# Patient Record
Sex: Male | Born: 1950 | Race: White | Hispanic: No | Marital: Single | Smoking: Never smoker
Health system: Southern US, Community
[De-identification: ages and names within clinical notes are randomized; demographics above are authoritative.]

## PROBLEM LIST (undated history)

## (undated) DIAGNOSIS — G4730 Sleep apnea, unspecified: Secondary | ICD-10-CM

## (undated) DIAGNOSIS — R221 Localized swelling, mass and lump, neck: Secondary | ICD-10-CM

## (undated) DIAGNOSIS — R809 Proteinuria, unspecified: Secondary | ICD-10-CM

## (undated) DIAGNOSIS — E119 Type 2 diabetes mellitus without complications: Secondary | ICD-10-CM

## (undated) DIAGNOSIS — I10 Essential (primary) hypertension: Secondary | ICD-10-CM

## (undated) DIAGNOSIS — J392 Other diseases of pharynx: Secondary | ICD-10-CM

## (undated) DIAGNOSIS — F419 Anxiety disorder, unspecified: Secondary | ICD-10-CM

## (undated) HISTORY — DX: Type 2 diabetes mellitus without complications: E11.9

## (undated) HISTORY — PX: OTHER SURGICAL HISTORY: SHX169

## (undated) HISTORY — DX: Proteinuria, unspecified: R80.9

## (undated) HISTORY — DX: Anxiety disorder, unspecified: F41.9

## (undated) HISTORY — DX: Sleep apnea, unspecified: G47.30

## (undated) HISTORY — DX: Essential (primary) hypertension: I10

---

## 2002-05-03 ENCOUNTER — Observation Stay (HOSPITAL_COMMUNITY): Admission: EM | Admit: 2002-05-03 | Discharge: 2002-05-04 | Payer: Self-pay | Admitting: Emergency Medicine

## 2002-05-03 ENCOUNTER — Encounter: Payer: Self-pay | Admitting: Emergency Medicine

## 2005-10-04 ENCOUNTER — Ambulatory Visit: Payer: Self-pay | Admitting: Cardiology

## 2009-03-19 ENCOUNTER — Emergency Department (HOSPITAL_COMMUNITY): Admission: EM | Admit: 2009-03-19 | Discharge: 2009-03-19 | Payer: Self-pay | Admitting: Emergency Medicine

## 2010-10-27 NOTE — Discharge Summary (Signed)
   NAME:  [name], Aaron Key NO.:  [id number]   MEDICAL RECORD NO.:  [id number]                   PATIENT TYPE:  INP   LOCATION:  3735                                 FACILITY:  [facility]   PHYSICIAN:  Vesta Mixer, M.D.              DATE OF BIRTH:  06/28/1950   DATE OF ADMISSION:  05/03/2002  DATE OF DISCHARGE:  05/04/2002                                 DISCHARGE SUMMARY   DISCHARGE DIAGNOSES:  1. Chest pain, probably noncardiac.  2. History of anxiety.   DISCHARGE MEDICATIONS:  1. Enteric-coated aspirin 81 mg a day.  2. Prilosec 20 mg a day.   DISPOSITION:  The patient will see Dr. Elease Hashimoto in one week.  He is to call  sooner if he has any recurrent episodes of chest pain. We will schedule a  Cardiolite study as an outpatient.  If he has recurrent episodes of chest  pain that sound like angina, we will consider a heart catheterization.   HISTORY:  The patient is a 60 year old gentleman who was admitted with chest  pain.  Please see dictated H&P for further details.   HOSPITAL COURSE:  CHEST PAIN:  The patient ruled out for myocardial infarction.  His EKG  remained unchanged.  Given the atypical nature of his chest pain, he will be  discharged from the hospital today.  He wanted to wait until next week,  until after Thanksgiving to do the stress test.  He will come back to the  office at that time to discuss doing the stress test. I have asked him to  call me right away if he has any worsening problems.  He will be on a low-  fat, low-salt, low-cholesterol in the meantime.                                                Vesta Mixer, M.D.    PJN/MEDQ  D:  05/04/2002  T:  05/04/2002  Job:  [id number]

## 2010-10-27 NOTE — H&P (Signed)
NAME:  Aaron Key, DONATH NO.:  [id number]   MEDICAL RECORD NO.:  [id number]                   PATIENT TYPE:  INP   LOCATION:  3735                                 FACILITY:  [facility]   PHYSICIAN:  Vesta Mixer, M.D.              DATE OF BIRTH:  May 20, 1951   DATE OF ADMISSION:  05/03/2002  DATE OF DISCHARGE:                                HISTORY & PHYSICAL   HISTORY OF PRESENT ILLNESS:  The patient is a 60 year old gentleman with a  history of chest pains in the past.  He is admitted to the hospital for  recurrent episodes of chest pain.   The patient has been relatively healthy.  He has had some gastroesophageal  problems.  He has had chest pains in the past and had a negative stress test  approximately 5 years ago.   This morning he had the severe onset of chest pain associated with some  shortness of breath and mild diaphoresis.  The pain lasted between 4 and 5  minutes and resolved after he got up and stretched.  He belched and felt a  little bit better.  He went on to church but continued to feel uneasy.  He  presented to the hospital for further evaluation.  He has not had any  further episodes of chest pain since he has been here.   CURRENT MEDICATION:  Prilosec 20 mg a day.   ALLERGY:  He is allergic to PENICILLIN.   PAST MEDICAL HISTORY:  1. History of achalasia and a slow esophagus.  2. History of chest pain.   SOCIAL HISTORY:  The patient is a nonsmoker.  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   FAMILY HISTORY:  Noncontributory.   PHYSICAL EXAMINATION:  GENERAL: He is a middle-aged gentleman in no acute  distress.  He is alert and oriented x3 and his mood and affect are normal.  VITAL SIGNS: His vital signs are stable.  His heart rate is 82.  HEENT: Exam reveals 2+ carotids, he has no bruits, there is no JVD and no  thyromegaly.  LUNGS: Clear to auscultation.  HEART: Regular rate S1/S2.  He has no murmurs, gallops, or rubs.  His  chest  wall is nontender.  ABDOMEN: Good bowel sounds.  He has no hepatosplenomegaly.  He has no masses  or bruits.  EXTREMITIES: He has good pulses.  He has no calf tenderness.  There is no  clubbing, cyanosis, or edema.  NEUROLOGICAL: Cranial nerves II-XII are intact and his motor and sensory  function intact.  His gait was not assessed.   LABORATORY DATA:  His EKG reveals normal sinus rhythm.  He has very tiny Q  waves in the lateral leads.  He has no acute ST or T wave changes.   IMPRESSION:  The patient presents with rather atypical episodes of chest  pain.    PLAN:  We  will admit him for further evaluation.  We will collect serial  CPK's.  His initial set of CPK-MB was slightly elevated.  We will consider  heart catheterization versus an outpatient Cardiolite study based on the  workup overnight.  He is stable for the time being and does not need IV  heparin or nitroglycerin at this time.  His other medical problems remain  stable.                                               Vesta Mixer, M.D.    PJN/MEDQ  D:  05/03/2002  T:  05/03/2002  Job:  [id number]   cc:   Dr. Elby Beck

## 2012-11-24 ENCOUNTER — Ambulatory Visit (INDEPENDENT_AMBULATORY_CARE_PROVIDER_SITE_OTHER): Payer: BC Managed Care – PPO | Admitting: Family Medicine

## 2012-11-24 ENCOUNTER — Encounter: Payer: Self-pay | Admitting: Family Medicine

## 2012-11-24 VITALS — BP 132/80 | Temp 98.4°F | Wt 236.8 lb

## 2012-11-24 DIAGNOSIS — M7042 Prepatellar bursitis, left knee: Secondary | ICD-10-CM

## 2012-11-24 DIAGNOSIS — Z79899 Other long term (current) drug therapy: Secondary | ICD-10-CM

## 2012-11-24 DIAGNOSIS — E782 Mixed hyperlipidemia: Secondary | ICD-10-CM

## 2012-11-24 DIAGNOSIS — E119 Type 2 diabetes mellitus without complications: Secondary | ICD-10-CM

## 2012-11-24 DIAGNOSIS — M7040 Prepatellar bursitis, unspecified knee: Secondary | ICD-10-CM

## 2012-11-24 DIAGNOSIS — Z125 Encounter for screening for malignant neoplasm of prostate: Secondary | ICD-10-CM

## 2012-11-24 MED ORDER — LOSARTAN POTASSIUM 100 MG PO TABS: 100.0000 mg | ORAL_TABLET | Freq: Every day | ORAL | Status: DC

## 2012-11-24 MED ORDER — ETODOLAC 400 MG PO TABS: 400.0000 mg | ORAL_TABLET | Freq: Two times a day (BID) | ORAL | Status: DC

## 2012-11-24 MED ORDER — METFORMIN HCL 500 MG PO TABS: 1000.0000 mg | ORAL_TABLET | Freq: Two times a day (BID) | ORAL | Status: DC

## 2012-11-24 MED ORDER — OMEPRAZOLE 20 MG PO CPDR: 40.0000 mg | DELAYED_RELEASE_CAPSULE | Freq: Every day | ORAL | Status: DC

## 2012-11-24 MED ORDER — GLYBURIDE 5 MG PO TABS: 5.0000 mg | ORAL_TABLET | Freq: Two times a day (BID) | ORAL | Status: DC

## 2012-11-24 MED ORDER — CEPHALEXIN 500 MG PO CAPS: 500.0000 mg | ORAL_CAPSULE | Freq: Three times a day (TID) | ORAL | Status: DC

## 2012-11-24 NOTE — Progress Notes (Signed)
  Subjective:    Patient ID: Aaron Key, male    DOB: [birth year], 62 y.o.   MRN: [id number]  Knee Pain  The incident occurred more than 1 week ago. The incident occurred at home. The injury mechanism is unknown. The pain is present in the left knee. The quality of the pain is described as aching. The pain is at a severity of 5/10. The pain is moderate. The pain has been fluctuating since onset. He reports no foreign bodies present. The symptoms are aggravated by weight bearing. He has tried NSAIDs for the symptoms. The treatment provided moderate relief.   Recurrent pain and redness and swelling. Hx of chronic inflam of knee.  otc advil two at night, helped some   Review of Systems No fever or chills. Notes achiness elsewhere. No major history of knee injury. ROS otherwise negative    Objective:   Physical Exam  Alert mild distress. Vitals reviewed. Lungs clear. Heart regular in rhythm. HEENT normal. Left anterior knee warm inflamed tender joint itself good range of motion no crepitations      Assessment & Plan:   impression prepatellar bursitis-discussed plan Lodine 400 twice a day with food. Keflex 500 3 times a day to cover any bacterial component discussed. Also chronic meds refilled and appropriate blood work ordered recheck in one month. WSL

## 2012-12-10 LAB — BASIC METABOLIC PANEL
BUN: 19 mg/dL (ref 6–23)
CO2: 25 mEq/L (ref 19–32)
Chloride: 103 mEq/L (ref 96–112)
Creat: 1.06 mg/dL (ref 0.50–1.35)
Glucose, Bld: 209 mg/dL — ABNORMAL HIGH (ref 70–99)
Potassium: 4.7 mEq/L (ref 3.5–5.3)

## 2012-12-10 LAB — LIPID PANEL
HDL: 31 mg/dL — ABNORMAL LOW (ref >39)
LDL Cholesterol: 88 mg/dL (ref 0–99)
Total CHOL/HDL Ratio: 5.1 Ratio
Triglycerides: 201 mg/dL — ABNORMAL HIGH (ref <150)
VLDL: 40 mg/dL (ref 0–40)

## 2012-12-10 LAB — HEPATIC FUNCTION PANEL
Albumin: 4.6 g/dL (ref 3.5–5.2)
Total Bilirubin: 1 mg/dL (ref 0.3–1.2)
Total Protein: 7 g/dL (ref 6.0–8.3)

## 2012-12-11 LAB — MICROALBUMIN, URINE: Microalb, Ur: 1.98 mg/dL — ABNORMAL HIGH (ref 0.00–1.89)

## 2012-12-19 ENCOUNTER — Encounter: Payer: Self-pay | Admitting: *Deleted

## 2012-12-24 ENCOUNTER — Encounter: Payer: Self-pay | Admitting: Family Medicine

## 2012-12-24 ENCOUNTER — Ambulatory Visit (INDEPENDENT_AMBULATORY_CARE_PROVIDER_SITE_OTHER): Payer: BC Managed Care – PPO | Admitting: Family Medicine

## 2012-12-24 VITALS — BP 130/84 | Wt 231.0 lb

## 2012-12-24 DIAGNOSIS — E785 Hyperlipidemia, unspecified: Secondary | ICD-10-CM

## 2012-12-24 DIAGNOSIS — G4733 Obstructive sleep apnea (adult) (pediatric): Secondary | ICD-10-CM

## 2012-12-24 DIAGNOSIS — E119 Type 2 diabetes mellitus without complications: Secondary | ICD-10-CM | POA: Insufficient documentation

## 2012-12-24 DIAGNOSIS — E1149 Type 2 diabetes mellitus with other diabetic neurological complication: Secondary | ICD-10-CM

## 2012-12-24 DIAGNOSIS — K219 Gastro-esophageal reflux disease without esophagitis: Secondary | ICD-10-CM

## 2012-12-24 DIAGNOSIS — I10 Essential (primary) hypertension: Secondary | ICD-10-CM

## 2012-12-24 DIAGNOSIS — IMO0001 Reserved for inherently not codable concepts without codable children: Secondary | ICD-10-CM

## 2012-12-24 DIAGNOSIS — E1129 Type 2 diabetes mellitus with other diabetic kidney complication: Secondary | ICD-10-CM

## 2012-12-24 DIAGNOSIS — R809 Proteinuria, unspecified: Secondary | ICD-10-CM

## 2012-12-24 NOTE — Progress Notes (Signed)
Subjective:    Patient ID: Aaron Key, male    DOB: November 20, 1950, 62 y.o.   MRN: [id number]  Diabetes He presents for his follow-up diabetic visit. He has type 2 diabetes mellitus. His disease course has been fluctuating. Pertinent negatives for diabetes include no blurred vision, no chest pain and no fatigue. There are no hypoglycemic complications. Symptoms are stable. There are no diabetic complications. Risk factors for coronary artery disease include diabetes mellitus, dyslipidemia, hypertension and male sex. Current diabetic treatment includes oral agent (monotherapy). He is compliant with treatment most of the time. His weight is increasing steadily. He is following a generally unhealthy diet. Meal planning includes avoidance of concentrated sweets. He has not had a previous visit with a dietician. He rarely participates in exercise. His breakfast blood glucose is taken between 8-9 am. His breakfast blood glucose range is generally 130-140 mg/dl. An ACE inhibitor/angiotensin II receptor blocker is being taken. Eye exam is not current.   Not the best watching fats in the diet. A lot of fast food. occas low sugar spells. Knee pain has calmed down  History of sleep apnea. Patient claims compliance with CPAP device. Still definitely helps him.  History micro-proteinuria wonders about numbers at this time.  Trying to watch his blood pressure. He has cut down salt intake. Compliant with meds. Review of Systems  Constitutional: Negative for fatigue.  Eyes: Negative for blurred vision.  Cardiovascular: Negative for chest pain.       Objective:   Physical Exam Alert no acute distress. HEENT normal. Lungs clear. Heart regular rate and rhythm. Feet sensation intact. Pulses good.   Results for orders placed in visit on 11/24/12  BASIC METABOLIC PANEL      Result Value Range   Sodium 140  135 - 145 mEq/L   Potassium 4.7  3.5 - 5.3 mEq/L   Chloride 103  96 - 112 mEq/L   CO2 25  19 - 32  mEq/L   Glucose, Bld 209 (*) 70 - 99 mg/dL   BUN 19  6 - 23 mg/dL   Creat 4.09  8.11 - 9.14 mg/dL   Calcium 9.5  8.4 - 78.2 mg/dL  HEMOGLOBIN N5A      Result Value Range   Hemoglobin A1C 7.0 (*) <5.7 %   Mean Plasma Glucose 154 (*) <117 mg/dL  PSA      Result Value Range   PSA 1.36  <=4.00 ng/mL  LIPID PANEL      Result Value Range   Cholesterol 159  0 - 200 mg/dL   Triglycerides 213 (*) <150 mg/dL   HDL 31 (*) >08 mg/dL   Total CHOL/HDL Ratio 5.1     VLDL 40  0 - 40 mg/dL   LDL Cholesterol 88  0 - 99 mg/dL  HEPATIC FUNCTION PANEL      Result Value Range   Total Bilirubin 1.0  0.3 - 1.2 mg/dL   Bilirubin, Direct 0.2  0.0 - 0.3 mg/dL   Indirect Bilirubin 0.8  0.0 - 0.9 mg/dL   Alkaline Phosphatase 111  39 - 117 U/L   AST 15  0 - 37 U/L   ALT 13  0 - 53 U/L   Total Protein 7.0  6.0 - 8.3 g/dL   Albumin 4.6  3.5 - 5.2 g/dL  MICROALBUMIN, URINE      Result Value Range   Microalb, Ur 1.98 (*) 0.00 - 1.89 mg/dL       Assessment & Plan:  Impression #1 type 2 diabetes decent control. #2 hypertension good control. #3 hyperlipidemia and discuss. #4 micro-proteinuria improved. #5 sleep apnea ongoing. #6 reflux stable. Plan maintain same meds. Diet exercise discussed. Recheck in 6 months. Earlier if any difficulties. WSL

## 2013-04-06 ENCOUNTER — Other Ambulatory Visit: Payer: Self-pay | Admitting: Family Medicine

## 2013-05-08 ENCOUNTER — Other Ambulatory Visit: Payer: Self-pay | Admitting: Family Medicine

## 2013-06-09 ENCOUNTER — Other Ambulatory Visit: Payer: Self-pay | Admitting: Family Medicine

## 2013-06-15 ENCOUNTER — Encounter: Payer: Self-pay | Admitting: Family Medicine

## 2013-06-15 ENCOUNTER — Ambulatory Visit (INDEPENDENT_AMBULATORY_CARE_PROVIDER_SITE_OTHER): Payer: BC Managed Care – PPO | Admitting: Family Medicine

## 2013-06-15 VITALS — BP 130/88 | Ht 70.0 in | Wt 237.5 lb

## 2013-06-15 DIAGNOSIS — IMO0001 Reserved for inherently not codable concepts without codable children: Secondary | ICD-10-CM

## 2013-06-15 DIAGNOSIS — E119 Type 2 diabetes mellitus without complications: Secondary | ICD-10-CM

## 2013-06-15 DIAGNOSIS — R809 Proteinuria, unspecified: Secondary | ICD-10-CM

## 2013-06-15 DIAGNOSIS — E1129 Type 2 diabetes mellitus with other diabetic kidney complication: Secondary | ICD-10-CM

## 2013-06-15 DIAGNOSIS — I10 Essential (primary) hypertension: Secondary | ICD-10-CM

## 2013-06-15 DIAGNOSIS — G4733 Obstructive sleep apnea (adult) (pediatric): Secondary | ICD-10-CM

## 2013-06-15 DIAGNOSIS — K219 Gastro-esophageal reflux disease without esophagitis: Secondary | ICD-10-CM

## 2013-06-15 LAB — POCT GLYCOSYLATED HEMOGLOBIN (HGB A1C): HEMOGLOBIN A1C: 7.2

## 2013-06-15 MED ORDER — OMEPRAZOLE 20 MG PO CPDR: 40.0000 mg | DELAYED_RELEASE_CAPSULE | Freq: Every day | ORAL | Status: DC

## 2013-06-15 MED ORDER — LOSARTAN POTASSIUM 100 MG PO TABS: 100.0000 mg | ORAL_TABLET | Freq: Every day | ORAL | Status: DC

## 2013-06-15 MED ORDER — GLYBURIDE 5 MG PO TABS: 5.0000 mg | ORAL_TABLET | Freq: Two times a day (BID) | ORAL | Status: DC

## 2013-06-15 MED ORDER — METFORMIN HCL 500 MG PO TABS: ORAL_TABLET | ORAL | Status: DC

## 2013-06-15 NOTE — Progress Notes (Signed)
   Subjective:    Patient ID: Aaron Key, male    DOB: 06/22/1950, 63 y.o.   MRN: [id number]  Diabetes He presents for his follow-up diabetic visit. He has type 2 diabetes mellitus. His disease course has been improving. There are no hypoglycemic associated symptoms. There are no diabetic associated symptoms. There are no hypoglycemic complications. Symptoms are stable. There are no diabetic complications. There are no known risk factors for coronary artery disease. Current diabetic treatment includes oral agent (dual therapy). He is compliant with treatment all of the time.   patient claims compliance with his blood pressure medicine. Blood pressure good when checked elsewhere. No obvious side effects from the medicine.  Compliant with reflux medicine. Overall doing good job for him.  Uses his CPAP faithfully. Helps his sleep apnea considerably. Uses every night.   Patient has no other concerns at this time.     Review of Systems No chest pain no headache no back pain no change about habits no blood in stool ROS otherwise negative.    Objective:   Physical Exam  Alert obesity present. H&T normal. Lungs clear. Heart rare rhythm. Ankles without edema.  Results for orders placed in visit on 06/15/13  POCT GLYCOSYLATED HEMOGLOBIN (HGB A1C)      Result Value Range   Hemoglobin A1C 7.2         Assessment & Plan:  Impression 1 type 2 diabetes control good though not perfect. Discussed length. #Hypertension good control. #3 sleep apnea ongoing. #4 reflux stable. Plan diet exercise discussed in encourage. Followup as scheduled. A1c need to improve at next visit. Rationale discussed. WSL

## 2013-06-21 LAB — HM DIABETES EYE EXAM

## 2013-12-24 ENCOUNTER — Telehealth: Payer: Self-pay | Admitting: Family Medicine

## 2013-12-24 DIAGNOSIS — I10 Essential (primary) hypertension: Secondary | ICD-10-CM

## 2013-12-24 DIAGNOSIS — E119 Type 2 diabetes mellitus without complications: Secondary | ICD-10-CM

## 2013-12-24 DIAGNOSIS — Z79899 Other long term (current) drug therapy: Secondary | ICD-10-CM

## 2013-12-24 DIAGNOSIS — Z125 Encounter for screening for malignant neoplasm of prostate: Secondary | ICD-10-CM

## 2013-12-24 NOTE — Telephone Encounter (Signed)
PT has appt 8/5 does he need bw   Call when ready

## 2013-12-24 NOTE — Telephone Encounter (Signed)
That pt needs that exact same test as 1 year ago and f/u ov to dicuss

## 2013-12-24 NOTE — Telephone Encounter (Signed)
Patient had Lipid, Liver, Met 7, HgbA1c, PSA, Micro Albumin urine on 12/2012

## 2013-12-25 NOTE — Telephone Encounter (Signed)
Patient notified and verbalized understanding. 

## 2013-12-30 ENCOUNTER — Other Ambulatory Visit: Payer: Self-pay | Admitting: Family Medicine

## 2014-01-06 LAB — HEMOGLOBIN A1C
Hgb A1c MFr Bld: 8.7 % — ABNORMAL HIGH (ref <5.7)
Mean Plasma Glucose: 203 mg/dL — ABNORMAL HIGH (ref <117)

## 2014-01-06 LAB — BASIC METABOLIC PANEL
BUN: 20 mg/dL (ref 6–23)
CO2: 28 mEq/L (ref 19–32)
Calcium: 9.3 mg/dL (ref 8.4–10.5)
Chloride: 102 mEq/L (ref 96–112)
Creat: 0.96 mg/dL (ref 0.50–1.35)
GLUCOSE: 258 mg/dL — AB (ref 70–99)
Potassium: 4.5 mEq/L (ref 3.5–5.3)
Sodium: 139 mEq/L (ref 135–145)

## 2014-01-06 LAB — LIPID PANEL
CHOLESTEROL: 149 mg/dL (ref 0–200)
HDL: 29 mg/dL — ABNORMAL LOW (ref >39)
LDL Cholesterol: 60 mg/dL (ref 0–99)
Total CHOL/HDL Ratio: 5.1 Ratio
Triglycerides: 300 mg/dL — ABNORMAL HIGH (ref <150)
VLDL: 60 mg/dL — ABNORMAL HIGH (ref 0–40)

## 2014-01-06 LAB — HEPATIC FUNCTION PANEL
ALT: 18 U/L (ref 0–53)
AST: 13 U/L (ref 0–37)
Albumin: 4.5 g/dL (ref 3.5–5.2)
Alkaline Phosphatase: 115 U/L (ref 39–117)
BILIRUBIN DIRECT: 0.2 mg/dL (ref 0.0–0.3)
BILIRUBIN INDIRECT: 1.1 mg/dL (ref 0.2–1.2)
Total Bilirubin: 1.3 mg/dL — ABNORMAL HIGH (ref 0.2–1.2)
Total Protein: 7.2 g/dL (ref 6.0–8.3)

## 2014-01-07 LAB — PSA: PSA: 1.23 ng/mL (ref <=4.00)

## 2014-01-07 LAB — MICROALBUMIN, URINE: Microalb, Ur: 4.74 mg/dL — ABNORMAL HIGH (ref 0.00–1.89)

## 2014-01-13 ENCOUNTER — Ambulatory Visit (INDEPENDENT_AMBULATORY_CARE_PROVIDER_SITE_OTHER): Payer: BC Managed Care – PPO | Admitting: Family Medicine

## 2014-01-13 ENCOUNTER — Encounter: Payer: Self-pay | Admitting: Family Medicine

## 2014-01-13 VITALS — BP 144/88 | Ht 71.0 in | Wt 226.6 lb

## 2014-01-13 DIAGNOSIS — E1149 Type 2 diabetes mellitus with other diabetic neurological complication: Secondary | ICD-10-CM

## 2014-01-13 DIAGNOSIS — E785 Hyperlipidemia, unspecified: Secondary | ICD-10-CM

## 2014-01-13 DIAGNOSIS — K219 Gastro-esophageal reflux disease without esophagitis: Secondary | ICD-10-CM

## 2014-01-13 DIAGNOSIS — I10 Essential (primary) hypertension: Secondary | ICD-10-CM

## 2014-01-13 DIAGNOSIS — G4733 Obstructive sleep apnea (adult) (pediatric): Secondary | ICD-10-CM

## 2014-01-13 MED ORDER — LOSARTAN POTASSIUM 100 MG PO TABS: 100.0000 mg | ORAL_TABLET | Freq: Every day | ORAL | Status: DC

## 2014-01-13 MED ORDER — GLIPIZIDE 5 MG PO TABS: ORAL_TABLET | ORAL | Status: DC

## 2014-01-13 MED ORDER — OMEPRAZOLE 20 MG PO CPDR: 40.0000 mg | DELAYED_RELEASE_CAPSULE | Freq: Every day | ORAL | Status: DC

## 2014-01-13 MED ORDER — METFORMIN HCL 500 MG PO TABS: ORAL_TABLET | ORAL | Status: DC

## 2014-01-13 NOTE — Progress Notes (Signed)
   Subjective:    Patient ID: Aaron Key, male    DOB: 1951-02-09, 62 y.o.   MRN: [id number]  Diabetes He presents for his follow-up diabetic visit. He has type 2 diabetes mellitus. Current diabetic treatment includes oral agent (dual therapy). He is compliant with treatment all of the time. He is following a diabetic diet. He rarely participates in exercise. His breakfast blood glucose range is generally 140-180 mg/dl. He does not see a podiatrist.Eye exam is current.   Had bloodwork on 01/06/14. A1C was 8.7 on bloodwork.  Results for orders placed in visit on 12/24/13  LIPID PANEL      Result Value Ref Range   Cholesterol 149  0 - 200 mg/dL   Triglycerides 300 (*) <150 mg/dL   HDL 29 (*) >39 mg/dL   Total CHOL/HDL Ratio 5.1     VLDL 60 (*) 0 - 40 mg/dL   LDL Cholesterol 60  0 - 99 mg/dL  HEPATIC FUNCTION PANEL      Result Value Ref Range   Total Bilirubin 1.3 (*) 0.2 - 1.2 mg/dL   Bilirubin, Direct 0.2  0.0 - 0.3 mg/dL   Indirect Bilirubin 1.1  0.2 - 1.2 mg/dL   Alkaline Phosphatase 115  39 - 117 U/L   AST 13  0 - 37 U/L   ALT 18  0 - 53 U/L   Total Protein 7.2  6.0 - 8.3 g/dL   Albumin 4.5  3.5 - 5.2 g/dL  BASIC METABOLIC PANEL      Result Value Ref Range   Sodium 139  135 - 145 mEq/L   Potassium 4.5  3.5 - 5.3 mEq/L   Chloride 102  96 - 112 mEq/L   CO2 28  19 - 32 mEq/L   Glucose, Bld 258 (*) 70 - 99 mg/dL   BUN 20  6 - 23 mg/dL   Creat 0.96  0.50 - 1.35 mg/dL   Calcium 9.3  8.4 - 10.5 mg/dL  PSA      Result Value Ref Range   PSA 1.23  <=4.00 ng/mL  HEMOGLOBIN A1C      Result Value Ref Range   Hemoglobin A1C 8.7 (*) <5.7 %   Mean Plasma Glucose 203 (*) <117 mg/dL  MICROALBUMIN, URINE      Result Value Ref Range   Microalb, Ur 4.74 (*) 0.00 - 1.89 mg/dL   Does not ck bp  Any more, compliant with meds. Patient trying to watch salt intake. Unfortunately not exercising very much.  Continues to use his CPAP device for sleep. States it definitely  helps.  Review of Systems No headache no chest pain no back pain no abdominal pain no change about blood in stool ROS otherwise negative    Objective:   Physical Exam Alert no apparent distress. HEENT normal. Lungs clear. Heart regular in rhythm ankles without edema. C. diabetic foot exam       Assessment & Plan:  Impression 1 type 2 diabetes unsatisfactory control discussed at length. #2 hypertension good control currently #3 hyperlipidemia overall good control except triglycerides discussed #4 sleep apnea plan diet exercise discussed. Justin dose will double up glyburide and change it to glipizide. Not skip meals potential for low sugar spells discussed. Diet exercise discussed recheck in 3 months for wellness exam in 6 months for diabetes visit. WSL

## 2014-03-24 ENCOUNTER — Ambulatory Visit (INDEPENDENT_AMBULATORY_CARE_PROVIDER_SITE_OTHER): Payer: BC Managed Care – PPO | Admitting: Family Medicine

## 2014-03-24 ENCOUNTER — Ambulatory Visit: Payer: BC Managed Care – PPO

## 2014-03-24 ENCOUNTER — Encounter: Payer: Self-pay | Admitting: Family Medicine

## 2014-03-24 VITALS — BP 150/84 | Temp 98.8°F | Ht 71.0 in | Wt 226.0 lb

## 2014-03-24 DIAGNOSIS — J329 Chronic sinusitis, unspecified: Secondary | ICD-10-CM

## 2014-03-24 MED ORDER — AZITHROMYCIN 250 MG PO TABS
ORAL_TABLET | ORAL | Status: DC
Start: 2014-03-24 — End: 2014-04-21

## 2014-03-24 NOTE — Progress Notes (Signed)
   Subjective:    Patient ID: Aaron Key, male    DOB: 06/16/1950, 63 y.o.   MRN: [id number]  URI  This is a new problem. The current episode started yesterday. The problem has been unchanged. Maximum temperature: unmeasured. Associated symptoms include congestion, coughing and headaches. He has tried nothing for the symptoms. The treatment provided no relief.   Patient states that he has no other concerns at this time.   Low gr fever   Several others at work have similar symptoms.  Cough occasionally productive. Some frontal headache. Worse with coughing    Review of Systems  HENT: Positive for congestion.   Respiratory: Positive for cough.   Neurological: Positive for headaches.       Objective:   Physical Exam Alert moderate malaise. Vital stable. Frontal maxillary tenderness evident. Pharynx normal neck supple. Lungs clear. Heart regular rate and rhythm. We'll impression rhinosinusitis       Assessment & Plan:  Impression rhinosinusitis along with viral syndrome plan antibiotics prescribed. Symptomatic care discussed. Warning signs discussed. WSL

## 2014-04-21 ENCOUNTER — Ambulatory Visit (INDEPENDENT_AMBULATORY_CARE_PROVIDER_SITE_OTHER): Payer: BC Managed Care – PPO | Admitting: Family Medicine

## 2014-04-21 ENCOUNTER — Encounter: Payer: Self-pay | Admitting: Family Medicine

## 2014-04-21 VITALS — BP 142/78 | Ht 68.5 in | Wt 225.2 lb

## 2014-04-21 DIAGNOSIS — Z23 Encounter for immunization: Secondary | ICD-10-CM

## 2014-04-21 DIAGNOSIS — Z0000 Encounter for general adult medical examination without abnormal findings: Secondary | ICD-10-CM

## 2014-04-21 DIAGNOSIS — E0921 Drug or chemical induced diabetes mellitus with diabetic nephropathy: Secondary | ICD-10-CM

## 2014-04-21 DIAGNOSIS — E0869 Diabetes mellitus due to underlying condition with other specified complication: Secondary | ICD-10-CM

## 2014-04-21 MED ORDER — CANAGLIFLOZIN 300 MG PO TABS: ORAL_TABLET | ORAL | Status: DC

## 2014-04-21 MED ORDER — CANAGLIFLOZIN 100 MG PO TABS: ORAL_TABLET | ORAL | Status: DC

## 2014-04-21 NOTE — Progress Notes (Signed)
   Subjective:    Patient ID: Aaron Key, male    DOB: Oct 14, 1950, 63 y.o.   MRN: [id number]  HPI The patient comes in today for a wellness visit.    A review of their health history was completed.  A review of medications was also completed.  Any needed refills; none  Eating habits: pretty good  Falls/ MVA accidents in past few months: none  Regular exercise: no  Specialist pt sees on regular basis: none  Preventative health issues were discussed.   Additional concerns: none  Exercise not regular  Results for orders placed or performed in visit on 04/21/14  HM DIABETES EYE EXAM  Result Value Ref Range   HM Diabetic Eye Exam No Retinopathy No Retinopathy   Sugars unfortunately continue to remain high. Patient reports not exercising very much. Also reports fair compliance with diet but not the best possible.  Review of Systems  Constitutional: Negative for fever, activity change and appetite change.       Some fatigue at times.  HENT: Negative for congestion and rhinorrhea.   Eyes: Negative for discharge.  Respiratory: Negative for cough and wheezing.   Cardiovascular: Negative for chest pain.  Gastrointestinal: Negative for vomiting, abdominal pain and blood in stool.  Genitourinary: Negative for frequency and difficulty urinating.  Musculoskeletal: Negative for neck pain.  Skin: Negative for rash.  Allergic/Immunologic: Negative for environmental allergies and food allergies.  Neurological: Negative for weakness and headaches.  Psychiatric/Behavioral: Negative for agitation.  All other systems reviewed and are negative.      Objective:   Physical Exam  Constitutional: He appears well-developed and well-nourished.  HENT:  Head: Normocephalic and atraumatic.  Right Ear: External ear normal.  Left Ear: External ear normal.  Nose: Nose normal.  Mouth/Throat: Oropharynx is clear and moist.  Eyes: EOM are normal. Pupils are equal, round, and reactive to  light.  Neck: Normal range of motion. Neck supple. No thyromegaly present.  Cardiovascular: Normal rate, regular rhythm and normal heart sounds.   No murmur heard. Pulmonary/Chest: Effort normal and breath sounds normal. No respiratory distress. He has no wheezes.  Abdominal: Soft. Bowel sounds are normal. He exhibits no distension and no mass. There is no tenderness.  Genitourinary: Prostate normal and penis normal.  Musculoskeletal: Normal range of motion. He exhibits no edema.  Lymphadenopathy:    He has no cervical adenopathy.  Neurological: He is alert. He exhibits normal muscle tone.  Skin: Skin is warm and dry. No erythema.  Psychiatric: He has a normal mood and affect. His behavior is normal. Judgment normal.  Vitals reviewed.         Assessment & Plan:  Impression #1 wellness exam #2 type 2 diabetes control suboptimal in discussed. Time near add additional medication. Significant discussion held. We'll proceed with invokana plan diet exercise discussed. Appropriate medicines prescribed. Patient work harder on diet exercise. Flu vaccine today. WSL

## 2014-07-07 ENCOUNTER — Other Ambulatory Visit: Payer: Self-pay | Admitting: Family Medicine

## 2014-07-28 ENCOUNTER — Encounter: Payer: Self-pay | Admitting: Family Medicine

## 2014-07-28 ENCOUNTER — Ambulatory Visit (INDEPENDENT_AMBULATORY_CARE_PROVIDER_SITE_OTHER): Payer: BLUE CROSS/BLUE SHIELD | Admitting: Family Medicine

## 2014-07-28 VITALS — BP 150/86 | Ht 68.5 in | Wt 226.0 lb

## 2014-07-28 DIAGNOSIS — G4733 Obstructive sleep apnea (adult) (pediatric): Secondary | ICD-10-CM

## 2014-07-28 DIAGNOSIS — I10 Essential (primary) hypertension: Secondary | ICD-10-CM

## 2014-07-28 DIAGNOSIS — E785 Hyperlipidemia, unspecified: Secondary | ICD-10-CM

## 2014-07-28 DIAGNOSIS — E119 Type 2 diabetes mellitus without complications: Secondary | ICD-10-CM

## 2014-07-28 LAB — POCT GLYCOSYLATED HEMOGLOBIN (HGB A1C): HEMOGLOBIN A1C: 6.4

## 2014-07-28 MED ORDER — OMEPRAZOLE 20 MG PO CPDR: 40.0000 mg | DELAYED_RELEASE_CAPSULE | Freq: Every day | ORAL | Status: DC

## 2014-07-28 MED ORDER — CANAGLIFLOZIN 300 MG PO TABS: 300.0000 mg | ORAL_TABLET | Freq: Every day | ORAL | Status: DC

## 2014-07-28 MED ORDER — LOSARTAN POTASSIUM 100 MG PO TABS: 100.0000 mg | ORAL_TABLET | Freq: Every day | ORAL | Status: DC

## 2014-07-28 MED ORDER — GLIPIZIDE 5 MG PO TABS: 10.0000 mg | ORAL_TABLET | Freq: Two times a day (BID) | ORAL | Status: DC

## 2014-07-28 MED ORDER — METFORMIN HCL 500 MG PO TABS: ORAL_TABLET | ORAL | Status: DC

## 2014-07-28 NOTE — Progress Notes (Signed)
   Subjective:    Patient ID: Aaron Key, male    DOB: [birth year], 64 y.o.   MRN: [id number]  Diabetes He presents for his follow-up diabetic visit. He has type 2 diabetes mellitus. There are no hypoglycemic associated symptoms. There are no diabetic associated symptoms. There are no hypoglycemic complications. Symptoms are stable. There are no diabetic complications. Current diabetic treatment includes oral agent (triple therapy) (INVOKANA, METFORMIN & GLIPIZIDE). He is compliant with treatment all of the time. His weight is stable. He has not had a previous visit with a dietitian. Exercise: PATIENT WALKS ALOT @ WORK. His home blood glucose trend is decreasing steadily (HOME MONITORING WAS IN 170'S DOWN TO 140-150). His overall blood glucose range is 140-180 mg/dl. He does not see a podiatrist.Eye exam is current.   Results for orders placed or performed in visit on 07/28/14  POCT glycosylated hemoglobin (Hb A1C)  Result Value Ref Range   Hemoglobin A1C 6.4     Sleep apnea using mask faithfully  Pt needs filter and tubing for cpap machine. Overall the C [place] still definitely helping patients breathing and daytime restfulness.  Compliant with blood pressure medication. No obvious side effects. Watching salt intake.  Walking some on the weekend but mostly working a lot of overtime and not getting regular exercise.  Reflux overall is stable  Review of Systems No headache no chest pain no back pain abdominal pain no change in bowel habits no blood in stool    Objective:   Physical Exam Alert no acute distress vital stable HEENT normal. Blood pressure improved on repeat 136/84. Lungs clear. Heart regular in rhythm. Ankles without edema.       Assessment & Plan:  Impression 1 type 2 diabetes control still good discussed #2 hypertension good control #3 sleep apnea ongoing discussed #4 reflux clinically stable #5 obesity discussed plan exercise read encourage. Maintain other  Medications. Follow-up as scheduled. Medications refilled. WSL

## 2014-09-01 LAB — HM DIABETES EYE EXAM

## 2015-01-26 ENCOUNTER — Ambulatory Visit: Payer: BLUE CROSS/BLUE SHIELD | Admitting: Family Medicine

## 2015-03-02 ENCOUNTER — Ambulatory Visit (INDEPENDENT_AMBULATORY_CARE_PROVIDER_SITE_OTHER): Payer: BLUE CROSS/BLUE SHIELD | Admitting: Family Medicine

## 2015-03-02 ENCOUNTER — Encounter: Payer: Self-pay | Admitting: Family Medicine

## 2015-03-02 VITALS — BP 130/82 | Ht 68.5 in | Wt 222.4 lb

## 2015-03-02 DIAGNOSIS — Z23 Encounter for immunization: Secondary | ICD-10-CM | POA: Diagnosis not present

## 2015-03-02 DIAGNOSIS — E785 Hyperlipidemia, unspecified: Secondary | ICD-10-CM | POA: Diagnosis not present

## 2015-03-02 DIAGNOSIS — Z125 Encounter for screening for malignant neoplasm of prostate: Secondary | ICD-10-CM | POA: Diagnosis not present

## 2015-03-02 DIAGNOSIS — E119 Type 2 diabetes mellitus without complications: Secondary | ICD-10-CM | POA: Diagnosis not present

## 2015-03-02 LAB — POCT GLYCOSYLATED HEMOGLOBIN (HGB A1C): HEMOGLOBIN A1C: 6.3

## 2015-03-02 MED ORDER — METFORMIN HCL 500 MG PO TABS
ORAL_TABLET | ORAL | Status: DC
Start: 2015-03-02 — End: 2015-08-29

## 2015-03-02 MED ORDER — CANAGLIFLOZIN 300 MG PO TABS: 300.0000 mg | ORAL_TABLET | Freq: Every day | ORAL | Status: DC

## 2015-03-02 MED ORDER — OMEPRAZOLE 20 MG PO CPDR: 40.0000 mg | DELAYED_RELEASE_CAPSULE | Freq: Every day | ORAL | Status: DC

## 2015-03-02 MED ORDER — GLIPIZIDE 5 MG PO TABS: 10.0000 mg | ORAL_TABLET | Freq: Two times a day (BID) | ORAL | Status: DC

## 2015-03-02 MED ORDER — LOSARTAN POTASSIUM 100 MG PO TABS: 100.0000 mg | ORAL_TABLET | Freq: Every day | ORAL | Status: DC

## 2015-03-02 NOTE — Addendum Note (Signed)
Addended by: Dairl Ponder on: 03/02/2015 09:55 AM   Modules accepted: Orders

## 2015-03-02 NOTE — Progress Notes (Signed)
   Subjective:    Patient ID: Aaron Key, male    DOB: [birth year], 64 y.o.   MRN: [id number]  Diabetes He presents for his initial diabetic visit. He has type 2 diabetes mellitus. Risk factors for coronary artery disease include diabetes mellitus, dyslipidemia and hypertension. Current diabetic treatment includes oral agent (triple therapy). He is compliant with treatment all of the time. His weight is stable. He is following a diabetic diet. He has not had a previous visit with a dietitian. He does not see a podiatrist.Eye exam is current.    Pt sig walking, trying to stay active  Fair to good on diet, trying to do hsi best,  Now fasting  Needs b w tod ay   Results for orders placed or performed in visit on 03/02/15  POCT glycosylated hemoglobin (Hb A1C)  Result Value Ref Range   Hemoglobin A1C 6.3        Review of Systems     Objective:   Physical Exam        Assessment & Plan:

## 2015-03-03 LAB — HEPATIC FUNCTION PANEL
ALBUMIN: 4.8 g/dL (ref 3.6–4.8)
ALK PHOS: 108 IU/L (ref 39–117)
ALT: 15 IU/L (ref 0–44)
AST: 10 IU/L (ref 0–40)
BILIRUBIN TOTAL: 1.2 mg/dL (ref 0.0–1.2)
Bilirubin, Direct: 0.25 mg/dL (ref 0.00–0.40)
TOTAL PROTEIN: 7.4 g/dL (ref 6.0–8.5)

## 2015-03-03 LAB — BASIC METABOLIC PANEL
BUN/Creatinine Ratio: 17 (ref 10–22)
BUN: 19 mg/dL (ref 8–27)
CO2: 24 mmol/L (ref 18–29)
CREATININE: 1.09 mg/dL (ref 0.76–1.27)
Calcium: 10.1 mg/dL (ref 8.6–10.2)
Chloride: 99 mmol/L (ref 97–108)
GFR, EST AFRICAN AMERICAN: 82 mL/min/{1.73_m2} (ref >59)
GFR, EST NON AFRICAN AMERICAN: 71 mL/min/{1.73_m2} (ref >59)
Glucose: 185 mg/dL — ABNORMAL HIGH (ref 65–99)
POTASSIUM: 4.8 mmol/L (ref 3.5–5.2)
SODIUM: 140 mmol/L (ref 134–144)

## 2015-03-03 LAB — LIPID PANEL
CHOL/HDL RATIO: 5 ratio (ref 0.0–5.0)
Cholesterol, Total: 145 mg/dL (ref 100–199)
HDL: 29 mg/dL — ABNORMAL LOW (ref >39)
LDL CALC: 62 mg/dL (ref 0–99)
Triglycerides: 270 mg/dL — ABNORMAL HIGH (ref 0–149)
VLDL Cholesterol Cal: 54 mg/dL — ABNORMAL HIGH (ref 5–40)

## 2015-03-03 LAB — PSA: Prostate Specific Ag, Serum: 1.4 ng/mL (ref 0.0–4.0)

## 2015-03-03 LAB — MICROALBUMIN / CREATININE URINE RATIO
Creatinine, Urine: 86.8 mg/dL
MICROALB/CREAT RATIO: 61.8 mg/g creat — ABNORMAL HIGH (ref 0.0–30.0)
MICROALBUM., U, RANDOM: 53.6 ug/mL

## 2015-03-11 ENCOUNTER — Encounter: Payer: Self-pay | Admitting: Family Medicine

## 2015-04-04 ENCOUNTER — Telehealth: Payer: Self-pay | Admitting: Family Medicine

## 2015-04-04 NOTE — Telephone Encounter (Signed)
Let's do 

## 2015-04-04 NOTE — Telephone Encounter (Signed)
Strap not working good if we could get this done today please

## 2015-04-04 NOTE — Telephone Encounter (Signed)
Rx faxed to [facility]- Patient notified.

## 2015-04-04 NOTE — Telephone Encounter (Signed)
Pt was told by Dr Richardson Landry when seen a few weeks back that he would  Send in a script for his CPAP supplies  Can we send this please  [place]

## 2015-05-24 ENCOUNTER — Telehealth: Payer: Self-pay | Admitting: Family Medicine

## 2015-05-24 NOTE — Telephone Encounter (Signed)
Rx prior auth APPROVED for pt's canagliflozin (INVOKANA) 300 MG TABS tablet, valid until 05/23/16 through [employer]

## 2015-08-10 LAB — HM DIABETES EYE EXAM

## 2015-08-29 ENCOUNTER — Other Ambulatory Visit: Payer: Self-pay | Admitting: Family Medicine

## 2015-08-31 ENCOUNTER — Ambulatory Visit (INDEPENDENT_AMBULATORY_CARE_PROVIDER_SITE_OTHER): Payer: Medicare Other | Admitting: Family Medicine

## 2015-08-31 ENCOUNTER — Encounter: Payer: Self-pay | Admitting: Gastroenterology

## 2015-08-31 ENCOUNTER — Encounter: Payer: Self-pay | Admitting: Family Medicine

## 2015-08-31 VITALS — BP 132/80 | Ht 69.25 in | Wt 218.5 lb

## 2015-08-31 DIAGNOSIS — E119 Type 2 diabetes mellitus without complications: Secondary | ICD-10-CM

## 2015-08-31 DIAGNOSIS — Z23 Encounter for immunization: Secondary | ICD-10-CM

## 2015-08-31 DIAGNOSIS — Z0000 Encounter for general adult medical examination without abnormal findings: Secondary | ICD-10-CM | POA: Diagnosis not present

## 2015-08-31 DIAGNOSIS — I10 Essential (primary) hypertension: Secondary | ICD-10-CM

## 2015-08-31 LAB — POCT GLYCOSYLATED HEMOGLOBIN (HGB A1C): Hemoglobin A1C: 6.8

## 2015-08-31 MED ORDER — METFORMIN HCL 500 MG PO TABS: 1000.0000 mg | ORAL_TABLET | Freq: Two times a day (BID) | ORAL | Status: DC

## 2015-08-31 MED ORDER — OMEPRAZOLE 20 MG PO CPDR: 40.0000 mg | DELAYED_RELEASE_CAPSULE | Freq: Every day | ORAL | Status: DC

## 2015-08-31 MED ORDER — GLIPIZIDE 5 MG PO TABS: 10.0000 mg | ORAL_TABLET | Freq: Two times a day (BID) | ORAL | Status: DC

## 2015-08-31 MED ORDER — CANAGLIFLOZIN 300 MG PO TABS: 300.0000 mg | ORAL_TABLET | Freq: Every day | ORAL | Status: DC

## 2015-08-31 MED ORDER — LOSARTAN POTASSIUM 100 MG PO TABS: ORAL_TABLET | ORAL | Status: DC

## 2015-08-31 NOTE — Progress Notes (Signed)
Subjective:    Patient ID: Aaron Key, male    DOB: [birth year], 65 y.o.   MRN: [id number]  HPI AWV- Annual Wellness Visit  The patient was seen for their annual wellness visit. The patient's past medical history, surgical history, and family history were reviewed. Pertinent vaccines were reviewed ( tetanus, pneumonia, shingles, flu) The patient's medication list was reviewed and updated.  The height and weight were entered. The patient's current BMI is:32.03  Cognitive screening was completed. Outcome of Mini - Cog: passed  Falls within the past 6 months:none  Current tobacco usage: non smoker (All patients who use tobacco were given written and verbal information on quitting)  Recent listing of emergency department/hospitalizations over the past year were reviewed.  current specialist the patient sees on a regular basis: none   Medicare annual wellness visit patient questionnaire was reviewed.  A written screening schedule for the patient for the next 5-10 years was given. Appropriate discussion of followup regarding next visit was discussed.  Patient has not had a colonoscopy yet.   Results for orders placed or performed in visit on 08/31/15  POCT glycosylated hemoglobin (Hb A1C)  Result Value Ref Range   Hemoglobin A1C 6.8    Sugars up and down, not consistent, often good,  Exercise on his feet a lot with work, active all day long   colonmoscopy not done yet, pt to try  No fam hx of colon ca or prost ca   Patient claims compliance with low pressure medication. Does not miss a dose. No obvious side effects. Blood pressure good when checked elsewhere.  Compliant with diabetes medicine. Reports sugars mostly running good.    Review of Systems  Constitutional: Negative for fever, activity change and appetite change.  HENT: Negative for congestion and rhinorrhea.   Eyes: Negative for discharge.  Respiratory: Negative for cough and wheezing.     Cardiovascular: Negative for chest pain.  Gastrointestinal: Negative for vomiting, abdominal pain and blood in stool.  Genitourinary: Negative for frequency and difficulty urinating.  Musculoskeletal: Negative for neck pain.  Skin: Negative for rash.  Allergic/Immunologic: Negative for environmental allergies and food allergies.  Neurological: Negative for weakness and headaches.  Psychiatric/Behavioral: Negative for agitation.       Objective:   Physical Exam  Constitutional: He appears well-developed and well-nourished.  HENT:  Head: Normocephalic and atraumatic.  Right Ear: External ear normal.  Left Ear: External ear normal.  Nose: Nose normal.  Mouth/Throat: Oropharynx is clear and moist.  Eyes: EOM are normal. Pupils are equal, round, and reactive to light.  Neck: Normal range of motion. Neck supple. No thyromegaly present.  Cardiovascular: Normal rate, regular rhythm and normal heart sounds.   No murmur heard. Pulmonary/Chest: Effort normal and breath sounds normal. No respiratory distress. He has no wheezes.  Abdominal: Soft. Bowel sounds are normal. He exhibits no distension and no mass. There is no tenderness.  Genitourinary: Penis normal.  Musculoskeletal: Normal range of motion. He exhibits no edema.  Lymphadenopathy:    He has no cervical adenopathy.  Neurological: He is alert. He exhibits normal muscle tone.  Skin: Skin is warm and dry. No erythema.  Psychiatric: He has a normal mood and affect. His behavior is normal. Judgment normal.  Vitals reviewed.         Assessment & Plan:  Impression 1 well adult exam/welcome to Medicare exam. EKG within normal limits #2 type 2 diabetes good control meds reviewed to maintain same #3 hypertension good  control meds reviewed maintain same plan Prevnar shot today. Diet exercise discussed. Medications refilled. Recheck in 6 months.

## 2015-09-12 ENCOUNTER — Encounter: Payer: Self-pay | Admitting: Family Medicine

## 2015-10-05 ENCOUNTER — Ambulatory Visit (AMBULATORY_SURGERY_CENTER): Payer: Self-pay

## 2015-10-05 VITALS — Ht 70.5 in | Wt 220.0 lb

## 2015-10-05 DIAGNOSIS — Z1211 Encounter for screening for malignant neoplasm of colon: Secondary | ICD-10-CM

## 2015-10-05 MED ORDER — SUPREP BOWEL PREP KIT 17.5-3.13-1.6 GM/177ML PO SOLN: 1.0000 | Freq: Once | ORAL | Status: DC

## 2015-10-05 NOTE — Progress Notes (Signed)
No allergies to eggs or soy No past problems with anesthesia except hard to wake after general No diet meds No home oxygen  Has email and internet; registered for emmi

## 2015-10-19 ENCOUNTER — Encounter: Payer: Self-pay | Admitting: Gastroenterology

## 2015-10-24 ENCOUNTER — Encounter: Payer: Self-pay | Admitting: Gastroenterology

## 2015-10-24 ENCOUNTER — Ambulatory Visit (AMBULATORY_SURGERY_CENTER): Payer: Medicare Other | Admitting: Gastroenterology

## 2015-10-24 VITALS — BP 149/86 | HR 84 | Temp 98.2°F | Resp 13

## 2015-10-24 DIAGNOSIS — D128 Benign neoplasm of rectum: Secondary | ICD-10-CM

## 2015-10-24 DIAGNOSIS — K635 Polyp of colon: Secondary | ICD-10-CM | POA: Diagnosis not present

## 2015-10-24 DIAGNOSIS — Z1211 Encounter for screening for malignant neoplasm of colon: Secondary | ICD-10-CM | POA: Diagnosis not present

## 2015-10-24 DIAGNOSIS — K219 Gastro-esophageal reflux disease without esophagitis: Secondary | ICD-10-CM | POA: Diagnosis not present

## 2015-10-24 DIAGNOSIS — D127 Benign neoplasm of rectosigmoid junction: Secondary | ICD-10-CM

## 2015-10-24 DIAGNOSIS — G4733 Obstructive sleep apnea (adult) (pediatric): Secondary | ICD-10-CM | POA: Diagnosis not present

## 2015-10-24 DIAGNOSIS — I10 Essential (primary) hypertension: Secondary | ICD-10-CM | POA: Diagnosis not present

## 2015-10-24 DIAGNOSIS — E119 Type 2 diabetes mellitus without complications: Secondary | ICD-10-CM | POA: Diagnosis not present

## 2015-10-24 DIAGNOSIS — K621 Rectal polyp: Secondary | ICD-10-CM | POA: Diagnosis not present

## 2015-10-24 DIAGNOSIS — D123 Benign neoplasm of transverse colon: Secondary | ICD-10-CM

## 2015-10-24 DIAGNOSIS — D120 Benign neoplasm of cecum: Secondary | ICD-10-CM | POA: Diagnosis not present

## 2015-10-24 DIAGNOSIS — D122 Benign neoplasm of ascending colon: Secondary | ICD-10-CM

## 2015-10-24 LAB — GLUCOSE, CAPILLARY
Glucose-Capillary: 194 mg/dL — ABNORMAL HIGH (ref 65–99)
Glucose-Capillary: 197 mg/dL — ABNORMAL HIGH (ref 65–99)

## 2015-10-24 MED ORDER — SODIUM CHLORIDE 0.9 % IV SOLN: 500.0000 mL | INTRAVENOUS | Status: DC

## 2015-10-24 NOTE — Progress Notes (Signed)
Called to room to assist during endoscopic procedure.  Patient ID and intended procedure confirmed with present staff. Received instructions for my participation in the procedure from the performing physician.  

## 2015-10-24 NOTE — Patient Instructions (Signed)
Discharge instructions given. Handouts on polyps and diverticulosis. Resume previous medications. YOU HAD AN ENDOSCOPIC PROCEDURE TODAY AT THE [facility]:   Refer to the procedure report that was given to you for any specific questions about what was found during the examination.  If the procedure report does not answer your questions, please call your gastroenterologist to clarify.  If you requested that your care partner not be given the details of your procedure findings, then the procedure report has been included in a sealed envelope for you to review at your convenience later.  YOU SHOULD EXPECT: Some feelings of bloating in the abdomen. Passage of more gas than usual.  Walking can help get rid of the air that was put into your GI tract during the procedure and reduce the bloating. If you had a lower endoscopy (such as a colonoscopy or flexible sigmoidoscopy) you may notice spotting of blood in your stool or on the toilet paper. If you underwent a bowel prep for your procedure, you may not have a normal bowel movement for a few days.  Please Note:  You might notice some irritation and congestion in your nose or some drainage.  This is from the oxygen used during your procedure.  There is no need for concern and it should clear up in a day or so.  SYMPTOMS TO REPORT IMMEDIATELY:   Following lower endoscopy (colonoscopy or flexible sigmoidoscopy):  Excessive amounts of blood in the stool  Significant tenderness or worsening of abdominal pains  Swelling of the abdomen that is new, acute  Fever of 100F or higher   For urgent or emergent issues, a gastroenterologist can be reached at any hour by calling [phone].   DIET: Your first meal following the procedure should be a small meal and then it is ok to progress to your normal diet. Heavy or fried foods are harder to digest and may make you feel nauseous or bloated.  Likewise, meals heavy in dairy and vegetables can  increase bloating.  Drink plenty of fluids but you should avoid alcoholic beverages for 24 hours.  ACTIVITY:  You should plan to take it easy for the rest of today and you should NOT DRIVE or use heavy machinery until tomorrow (because of the sedation medicines used during the test).    FOLLOW UP: Our staff will call the number listed on your records the next business day following your procedure to check on you and address any questions or concerns that you may have regarding the information given to you following your procedure. If we do not reach you, we will leave a message.  However, if you are feeling well and you are not experiencing any problems, there is no need to return our call.  We will assume that you have returned to your regular daily activities without incident.  If any biopsies were taken you will be contacted by phone or by letter within the next 1-3 weeks.  Please call us at [phone] if you have not heard about the biopsies in 3 weeks.    SIGNATURES/CONFIDENTIALITY: You and/or your care partner have signed paperwork which will be entered into your electronic medical record.  These signatures attest to the fact that that the information above on your After Visit Summary has been reviewed and is understood.  Full responsibility of the confidentiality of this discharge information lies with you and/or your care-partner. 

## 2015-10-24 NOTE — Progress Notes (Signed)
Report to PACU, RN, vss, BBS= Clear.  

## 2015-10-24 NOTE — Op Note (Signed)
[facility] Patient Name: Aaron Key Procedure Date: 10/24/2015 10:27 AM MRN: [id number] Endoscopist: Milus Banister , MD Age: 65 Referring MD:  Date of Birth: May 04, 1951 Gender: Male Procedure:                Colonoscopy Indications:              Screening for colorectal malignant neoplasm Medicines:                Monitored Anesthesia Care Procedure:                Pre-Anesthesia Assessment:                           - Prior to the procedure, a History and Physical                            was performed, and patient medications and                            allergies were reviewed. The patient's tolerance of                            previous anesthesia was also reviewed. The risks                            and benefits of the procedure and the sedation                            options and risks were discussed with the patient.                            All questions were answered, and informed consent                            was obtained. Prior Anticoagulants: The patient has                            taken no previous anticoagulant or antiplatelet                            agents. ASA Grade Assessment: II - A patient with                            mild systemic disease. After reviewing the risks                            and benefits, the patient was deemed in                            satisfactory condition to undergo the procedure.                           After obtaining informed consent, the colonoscope  was passed under direct vision. Throughout the                            procedure, the patient's blood pressure, pulse, and                            oxygen saturations were monitored continuously. The                            Model CF-HQ190L [id number]) scope was introduced                            through the anus and advanced to the the cecum,                            identified by appendiceal orifice and  ileocecal                            valve. The colonoscopy was performed without                            difficulty. The patient tolerated the procedure                            well. The quality of the bowel preparation was                            excellent. The ileocecal valve, appendiceal                            orifice, and rectum were photographed. Scope In: 10:48:12 AM Scope Out: 11:07:25 AM Scope Withdrawal Time: 0 hours 17 minutes 39 seconds  Total Procedure Duration: 0 hours 19 minutes 13 seconds  Findings:                 13 sessile polyps were found in the recto-sigmoid                            colon, transverse colon, ascending colon and cecum.                            The polyps were 3 to 7 mm in size. These polyps                            were removed with a cold snare. Resection and                            retrieval were complete.                           A few small-mouthed diverticula were found in the                            left colon.  The exam was otherwise without abnormality on                            direct and retroflexion views. Complications:            No immediate complications. Estimated blood loss:                            None. Estimated Blood Loss:     Estimated blood loss: none. Impression:               - 13 3 to 7 mm polyps at the recto-sigmoid colon,                            in the transverse colon, in the ascending colon and                            in the cecum, removed with a cold snare. Resected                            and retrieved.                           - Diverticulosis in the left colon.                           - The examination was otherwise normal on direct                            and retroflexion views. Recommendation:           - Patient has a contact number available for                            emergencies. The signs and symptoms of potential                             delayed complications were discussed with the                            patient. Return to normal activities tomorrow.                            Written discharge instructions were provided to the                            patient.                           - Resume previous diet.                           - Continue present medications.                           You will receive a letter within 2-3 weeks with the  pathology results and my final recommendations.                           If the polyp(s) is proven to be 'pre-cancerous' on                            pathology, you will need repeat colonoscopy in 1-3                            years given the large number of polyps. If the                            polyp(s) is NOT 'precancerous' on pathology then                            you should repeat colon cancer screening in 10                            years with colonoscopy without need for colon                            cancer screening by any method prior to then                            (including stool testing). Milus Banister, MD 10/24/2015 11:12:06 AM This report has been signed electronically.

## 2015-10-25 ENCOUNTER — Telehealth: Payer: Self-pay

## 2015-10-25 NOTE — Telephone Encounter (Signed)
Left message on answering machine. 

## 2015-10-30 ENCOUNTER — Encounter: Payer: Self-pay | Admitting: Gastroenterology

## 2015-11-03 ENCOUNTER — Telehealth: Payer: Self-pay | Admitting: Family Medicine

## 2015-11-03 NOTE — Telephone Encounter (Signed)
Received verbal notification to text patient his mychart code.

## 2016-01-13 ENCOUNTER — Other Ambulatory Visit: Payer: Self-pay

## 2016-01-13 MED ORDER — CANAGLIFLOZIN 300 MG PO TABS
300.0000 mg | ORAL_TABLET | Freq: Every day | ORAL | 0 refills | Status: DC
Start: 2016-01-13 — End: 2016-03-14

## 2016-02-29 ENCOUNTER — Ambulatory Visit: Payer: Medicare Other | Admitting: Family Medicine

## 2016-03-14 ENCOUNTER — Encounter: Payer: Self-pay | Admitting: Family Medicine

## 2016-03-14 ENCOUNTER — Ambulatory Visit (INDEPENDENT_AMBULATORY_CARE_PROVIDER_SITE_OTHER): Payer: Medicare Other | Admitting: Family Medicine

## 2016-03-14 VITALS — BP 140/82 | Ht 69.25 in | Wt 219.4 lb

## 2016-03-14 DIAGNOSIS — Z23 Encounter for immunization: Secondary | ICD-10-CM | POA: Diagnosis not present

## 2016-03-14 DIAGNOSIS — I10 Essential (primary) hypertension: Secondary | ICD-10-CM

## 2016-03-14 DIAGNOSIS — E785 Hyperlipidemia, unspecified: Secondary | ICD-10-CM | POA: Diagnosis not present

## 2016-03-14 DIAGNOSIS — Z125 Encounter for screening for malignant neoplasm of prostate: Secondary | ICD-10-CM

## 2016-03-14 DIAGNOSIS — E119 Type 2 diabetes mellitus without complications: Secondary | ICD-10-CM

## 2016-03-14 DIAGNOSIS — G4733 Obstructive sleep apnea (adult) (pediatric): Secondary | ICD-10-CM | POA: Diagnosis not present

## 2016-03-14 DIAGNOSIS — Z79899 Other long term (current) drug therapy: Secondary | ICD-10-CM | POA: Diagnosis not present

## 2016-03-14 LAB — POCT GLYCOSYLATED HEMOGLOBIN (HGB A1C): HEMOGLOBIN A1C: 7.3

## 2016-03-14 MED ORDER — CANAGLIFLOZIN 300 MG PO TABS: 300.0000 mg | ORAL_TABLET | Freq: Every day | ORAL | 1 refills | Status: DC

## 2016-03-14 MED ORDER — OMEPRAZOLE 20 MG PO CPDR: 40.0000 mg | DELAYED_RELEASE_CAPSULE | Freq: Every day | ORAL | 1 refills | Status: DC

## 2016-03-14 MED ORDER — LOSARTAN POTASSIUM 100 MG PO TABS: ORAL_TABLET | ORAL | 1 refills | Status: DC

## 2016-03-14 MED ORDER — METFORMIN HCL 500 MG PO TABS: 1000.0000 mg | ORAL_TABLET | Freq: Two times a day (BID) | ORAL | 1 refills | Status: DC

## 2016-03-14 MED ORDER — GLIPIZIDE 5 MG PO TABS: 10.0000 mg | ORAL_TABLET | Freq: Two times a day (BID) | ORAL | 1 refills | Status: DC

## 2016-03-14 NOTE — Progress Notes (Signed)
   Subjective:    Patient ID: Aaron Key, male    DOB: 03/20/1951, 65 y.o.   MRN: [id number]  patient prents ih umeros concens  Diabetes  He presents for his follow-up diabetic visit. He has type 2 diabetes mellitus. There are no hypoglycemic associated symptoms. There are no diabetic associated symptoms. There are no hypoglycemic complications. There are no diabetic complications. There are no known risk factors for coronary artery disease. Current diabetic treatment includes oral agent (triple therapy). He is compliant with treatment all of the time.   Results for orders placed or performed in visit on 03/14/16  POCT glycosylated hemoglobin (Hb A1C)  Result Value Ref Range   Hemoglobin A1C 7.3    Still working. Overall stable with the busines  Sugars between 80s an d low 100s  Walking a couple days per wk, gets out  Pt had 13 colon polyps  Distal hypersensitivityOf the toes. Worse in the evening time. Not true numbness.  Blood pressure medicine and blood pressure levels reviewed today with patient. Compliant with blood pressure medicine. States does not miss a dose. No obvious side effects. Blood pressure generally good when checked elsewhere. Watching salt intake.  . Patient on Cipro device. Uses faithfully. States it definitely helps him. Needs refill on mask and tubing. Uses every night     Patient has  no concerns at this time.      Review of Systems No headache, no major weight loss or weight gain, no chest pain no back pain abdominal pain no change in bowel habits complete ROS otherwise negative     Objective:   Physical Exam  Alert vitals stable, NAD. Blood pressure good on repeat. HEENT normal. Lungs clear. Heart regular rate and rhythm. Ankles without edema      Assessment & Plan:  Impression 1 type 2 diabetes good control though not perfect discussed maintain same #2 hypertension good control discussed maintain #3 chronic sleep apnea. With need for  sleep apnea device maintain #4 hyperlipidemia status uncertain will check blood work plan medications refilled. Diet exercise discussed. Flu shot. Recheck as scheduled [facility]

## 2016-03-15 LAB — BASIC METABOLIC PANEL
BUN/Creatinine Ratio: 18 (ref 10–24)
BUN: 17 mg/dL (ref 8–27)
CALCIUM: 9.8 mg/dL (ref 8.6–10.2)
CHLORIDE: 99 mmol/L (ref 96–106)
CO2: 25 mmol/L (ref 18–29)
Creatinine, Ser: 0.97 mg/dL (ref 0.76–1.27)
GFR calc Af Amer: 94 mL/min/{1.73_m2} (ref >59)
GFR, EST NON AFRICAN AMERICAN: 82 mL/min/{1.73_m2} (ref >59)
Glucose: 215 mg/dL — ABNORMAL HIGH (ref 65–99)
POTASSIUM: 4.8 mmol/L (ref 3.5–5.2)
Sodium: 140 mmol/L (ref 134–144)

## 2016-03-15 LAB — HEPATIC FUNCTION PANEL
ALBUMIN: 4.6 g/dL (ref 3.6–4.8)
ALT: 14 IU/L (ref 0–44)
AST: 14 IU/L (ref 0–40)
Alkaline Phosphatase: 114 IU/L (ref 39–117)
Bilirubin Total: 1 mg/dL (ref 0.0–1.2)
Bilirubin, Direct: 0.23 mg/dL (ref 0.00–0.40)
Total Protein: 7.2 g/dL (ref 6.0–8.5)

## 2016-03-15 LAB — PSA: Prostate Specific Ag, Serum: 1.2 ng/mL (ref 0.0–4.0)

## 2016-03-15 LAB — MICROALBUMIN / CREATININE URINE RATIO
CREATININE, UR: 95.8 mg/dL
MICROALBUM., U, RANDOM: 74.6 ug/mL
Microalb/Creat Ratio: 77.9 mg/g creat — ABNORMAL HIGH (ref 0.0–30.0)

## 2016-03-15 LAB — LIPID PANEL
CHOLESTEROL TOTAL: 141 mg/dL (ref 100–199)
Chol/HDL Ratio: 5 ratio units (ref 0.0–5.0)
HDL: 28 mg/dL — ABNORMAL LOW (ref >39)
LDL CALC: 41 mg/dL (ref 0–99)
Triglycerides: 359 mg/dL — ABNORMAL HIGH (ref 0–149)
VLDL CHOLESTEROL CAL: 72 mg/dL — AB (ref 5–40)

## 2016-04-09 ENCOUNTER — Encounter: Payer: Self-pay | Admitting: Family Medicine

## 2016-04-09 NOTE — Progress Notes (Signed)
no

## 2016-05-21 ENCOUNTER — Other Ambulatory Visit: Payer: Self-pay | Admitting: Family Medicine

## 2016-05-22 ENCOUNTER — Telehealth: Payer: Self-pay | Admitting: Family Medicine

## 2016-05-22 NOTE — Telephone Encounter (Signed)
Pt is needing a prescription for a new meter,test strips and lancets. Pt's wife stated that the pharmacy said to make sure there is a diagnostic code on the prescription so that medicare will pay for it.    [facility]

## 2016-05-22 NOTE — Telephone Encounter (Signed)
Notified Nevin Bloodgood that script will be faxed to pharmacy today.

## 2016-08-08 DIAGNOSIS — E11319 Type 2 diabetes mellitus with unspecified diabetic retinopathy without macular edema: Secondary | ICD-10-CM | POA: Diagnosis not present

## 2016-08-08 LAB — HM DIABETES EYE EXAM

## 2016-09-19 ENCOUNTER — Ambulatory Visit (INDEPENDENT_AMBULATORY_CARE_PROVIDER_SITE_OTHER): Payer: Medicare Other | Admitting: Family Medicine

## 2016-09-19 ENCOUNTER — Encounter: Payer: Self-pay | Admitting: Family Medicine

## 2016-09-19 VITALS — BP 148/86 | Ht 69.0 in | Wt 219.0 lb

## 2016-09-19 DIAGNOSIS — E119 Type 2 diabetes mellitus without complications: Secondary | ICD-10-CM | POA: Diagnosis not present

## 2016-09-19 DIAGNOSIS — Z0000 Encounter for general adult medical examination without abnormal findings: Secondary | ICD-10-CM | POA: Diagnosis not present

## 2016-09-19 DIAGNOSIS — I10 Essential (primary) hypertension: Secondary | ICD-10-CM

## 2016-09-19 DIAGNOSIS — G629 Polyneuropathy, unspecified: Secondary | ICD-10-CM

## 2016-09-19 DIAGNOSIS — Z23 Encounter for immunization: Secondary | ICD-10-CM

## 2016-09-19 DIAGNOSIS — G4733 Obstructive sleep apnea (adult) (pediatric): Secondary | ICD-10-CM

## 2016-09-19 LAB — POCT GLYCOSYLATED HEMOGLOBIN (HGB A1C): HEMOGLOBIN A1C: 7.2

## 2016-09-19 MED ORDER — DAPAGLIFLOZIN PROPANEDIOL 10 MG PO TABS: 10.0000 mg | ORAL_TABLET | Freq: Every day | ORAL | 0 refills | Status: DC

## 2016-09-19 MED ORDER — GLIPIZIDE 5 MG PO TABS: 10.0000 mg | ORAL_TABLET | Freq: Two times a day (BID) | ORAL | 1 refills | Status: DC

## 2016-09-19 MED ORDER — DAPAGLIFLOZIN PROPANEDIOL 10 MG PO TABS: 10.0000 mg | ORAL_TABLET | Freq: Every day | ORAL | 5 refills | Status: DC

## 2016-09-19 MED ORDER — OMEPRAZOLE 20 MG PO CPDR: 40.0000 mg | DELAYED_RELEASE_CAPSULE | Freq: Every day | ORAL | 1 refills | Status: DC

## 2016-09-19 MED ORDER — GLUCOSE BLOOD VI STRP: ORAL_STRIP | 5 refills | Status: DC

## 2016-09-19 MED ORDER — LOSARTAN POTASSIUM 100 MG PO TABS: ORAL_TABLET | ORAL | 1 refills | Status: DC

## 2016-09-19 MED ORDER — METFORMIN HCL 500 MG PO TABS: 1000.0000 mg | ORAL_TABLET | Freq: Two times a day (BID) | ORAL | 1 refills | Status: DC

## 2016-09-19 NOTE — Progress Notes (Signed)
Subjective:    Patient ID: Aaron Key, male    DOB: Sep 21, 1950, 66 y.o.   MRN: [id number]  HPI AWV- Annual Wellness Visit  The patient was seen for their annual wellness visit. The patient's past medical history, surgical history, and family history were reviewed. Pertinent vaccines were reviewed ( tetanus, pneumonia, shingles, flu) The patient's medication list was reviewed and updated.  The height and weight were entered. The patient's current BMI is: 32.34  Cognitive screening was completed. Outcome of Mini - Cog: pass  Falls within the past 6 months: none  Current tobacco usage: none (All patients who use tobacco were given written and verbal information on quitting)  Recent listing of emergency department/hospitalizations over the past year were reviewed.  current specialist the patient sees on a regular basis: none   Medicare annual wellness visit patient questionnaire was reviewed.  A written screening schedule for the patient for the next 5-10 years was given. Appropriate discussion of followup regarding next visit was discussed.  Results for orders placed or performed in visit on 09/19/16  POCT glycosylated hemoglobin (Hb A1C)  Result Value Ref Range   Hemoglobin A1C 7.2     Blood pressure medicine and blood pressure levels reviewed today with patient. Compliant with blood pressure medicine. States does not miss a dose. No obvious side effects. Blood pressure generally good when checked elsewhere. Watching salt intake.  Patient claims compliance with diabetes medication. No obvious side effects. Reports no substantial low sugar spells. Most numbers are generally in good range when checked fasting. Generally does not miss a dose of medication. Watching diabetic diet closely  Continues to use his sleep apnea device. States definitely helps. Tries not to miss it. Uses faithfully.  Notes numbness and tingling and hypersensitivity in the distal feet. Worse in the  evening time.  Review of Systems  Constitutional: Negative for activity change, appetite change and fever.  HENT: Negative for congestion and rhinorrhea.   Eyes: Negative for discharge.  Respiratory: Negative for cough and wheezing.   Cardiovascular: Negative for chest pain.  Gastrointestinal: Negative for abdominal pain, blood in stool and vomiting.  Genitourinary: Negative for difficulty urinating and frequency.  Musculoskeletal: Negative for neck pain.  Skin: Negative for rash.  Allergic/Immunologic: Negative for environmental allergies and food allergies.  Neurological: Negative for weakness and headaches.  Psychiatric/Behavioral: Negative for agitation.  All other systems reviewed and are negative.      Objective:   Physical Exam  Constitutional: He appears well-developed and well-nourished.  Obesity present  HENT:  Head: Normocephalic and atraumatic.  Right Ear: External ear normal.  Left Ear: External ear normal.  Nose: Nose normal.  Mouth/Throat: Oropharynx is clear and moist.  Eyes: EOM are normal. Pupils are equal, round, and reactive to light.  Neck: Normal range of motion. Neck supple. No thyromegaly present.  Cardiovascular: Normal rate, regular rhythm and normal heart sounds.   No murmur heard. Pulmonary/Chest: Effort normal and breath sounds normal. No respiratory distress. He has no wheezes.  Abdominal: Soft. Bowel sounds are normal. He exhibits no distension and no mass. There is no tenderness.  Genitourinary: Penis normal.  Musculoskeletal: Normal range of motion. He exhibits no edema.  Lymphadenopathy:    He has no cervical adenopathy.  Neurological: He is alert. He exhibits normal muscle tone.  Skin: Skin is warm and dry. No erythema.  Psychiatric: He has a normal mood and affect. His behavior is normal. Judgment normal.  Vitals reviewed.  Assessment & Plan:  Impression 1 wellness exam. Up-to-date on colonoscopy. Needs pneumonia vaccine.  Discussed diet exercise discussed gets yearly flu shots importance of yearly eye exams discussed 1 recently done normal #2 hypertension good control on repeat to maintain same discussed #3 type 2 diabetes near excellent control discussed rationale discussed maintain same meds. 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 #5 chronic sleep apnea with need for ongoing C Support. Rationale discussed.

## 2016-09-25 ENCOUNTER — Encounter: Payer: Self-pay | Admitting: *Deleted

## 2016-11-13 ENCOUNTER — Other Ambulatory Visit: Payer: Self-pay | Admitting: *Deleted

## 2016-11-13 MED ORDER — DAPAGLIFLOZIN PROPANEDIOL 10 MG PO TABS: 10.0000 mg | ORAL_TABLET | Freq: Every day | ORAL | 0 refills | Status: DC

## 2016-11-26 ENCOUNTER — Encounter: Payer: Self-pay | Admitting: Family Medicine

## 2016-11-26 ENCOUNTER — Ambulatory Visit (INDEPENDENT_AMBULATORY_CARE_PROVIDER_SITE_OTHER): Payer: Medicare Other | Admitting: Family Medicine

## 2016-11-26 VITALS — BP 166/90 | Ht 69.0 in | Wt 209.2 lb

## 2016-11-26 DIAGNOSIS — M5432 Sciatica, left side: Secondary | ICD-10-CM

## 2016-11-26 MED ORDER — NABUMETONE 750 MG PO TABS: ORAL_TABLET | ORAL | 0 refills | Status: DC

## 2016-11-26 NOTE — Progress Notes (Signed)
   Subjective:    Patient ID: Aaron Key, male    DOB: 12-Oct-1950, 66 y.o.   MRN: [id number]  Hip Pain   The incident occurred 3 to 5 days ago. The pain is present in the left leg and left hip (Lower back ). Associated symptoms include an inability to bear weight and a loss of motion. He has tried heat, ice and NSAIDs for the symptoms.  Leg Pain   Associated symptoms include an inability to bear weight and a loss of motion.   fri developed leg and hip pain  Off and on hx of low left bk pain with occas radiation, flares up at times  Using two advil tid  And local ice and cold  Worse with walking   Pain is often deep in the posterior thigh. Achy almost like a toothache worse with sustained standing also painful at night  paiPatient also has concerns of elevated blood pressure.   Review of Systems No headache, no major weight loss or weight gain, no chest pain no back pain abdominal pain no change in bowel habits complete ROS otherwise negative     Objective:   Physical Exam Blood pressure 150/90 bilateral alert some discomfort. Next  Lungs clear heart regular in rhythm. No CVA tenderness no sciatic notch tenderness plus minus straight leg raise. No hip pain with rotation neck trochanter hip pain.       Assessment & Plan:  Impression probable sciatica equivalent discussed known history low back disease #2 elevated blood pressure discuss #3 suboptimum type 2 diabetes discussed patient compliant with farxiga, not as effective for p patient does not want steroids understandably. Also does not want pain medicine. Discussed   Greater than 50% of this 25 minute face to face visit was spent in counseling and discussion and coordination of care regarding the above diagnosis/diagnosies

## 2016-11-28 DIAGNOSIS — M5430 Sciatica, unspecified side: Secondary | ICD-10-CM | POA: Diagnosis not present

## 2016-11-28 DIAGNOSIS — M9904 Segmental and somatic dysfunction of sacral region: Secondary | ICD-10-CM | POA: Diagnosis not present

## 2016-11-28 DIAGNOSIS — M9903 Segmental and somatic dysfunction of lumbar region: Secondary | ICD-10-CM | POA: Diagnosis not present

## 2016-11-28 DIAGNOSIS — M9905 Segmental and somatic dysfunction of pelvic region: Secondary | ICD-10-CM | POA: Diagnosis not present

## 2016-11-29 DIAGNOSIS — M9904 Segmental and somatic dysfunction of sacral region: Secondary | ICD-10-CM | POA: Diagnosis not present

## 2016-11-29 DIAGNOSIS — M9903 Segmental and somatic dysfunction of lumbar region: Secondary | ICD-10-CM | POA: Diagnosis not present

## 2016-11-29 DIAGNOSIS — M5430 Sciatica, unspecified side: Secondary | ICD-10-CM | POA: Diagnosis not present

## 2016-11-29 DIAGNOSIS — M9905 Segmental and somatic dysfunction of pelvic region: Secondary | ICD-10-CM | POA: Diagnosis not present

## 2016-12-03 DIAGNOSIS — M9903 Segmental and somatic dysfunction of lumbar region: Secondary | ICD-10-CM | POA: Diagnosis not present

## 2016-12-03 DIAGNOSIS — M9904 Segmental and somatic dysfunction of sacral region: Secondary | ICD-10-CM | POA: Diagnosis not present

## 2016-12-03 DIAGNOSIS — M9905 Segmental and somatic dysfunction of pelvic region: Secondary | ICD-10-CM | POA: Diagnosis not present

## 2016-12-03 DIAGNOSIS — M5430 Sciatica, unspecified side: Secondary | ICD-10-CM | POA: Diagnosis not present

## 2016-12-04 ENCOUNTER — Other Ambulatory Visit: Payer: Self-pay

## 2016-12-04 ENCOUNTER — Telehealth: Payer: Self-pay | Admitting: Family Medicine

## 2016-12-04 MED ORDER — GLUCOSE BLOOD VI STRP: ORAL_STRIP | 5 refills | Status: DC

## 2016-12-04 NOTE — Telephone Encounter (Signed)
Patient is needing Rx for diabetic test strips to [facility].

## 2016-12-04 NOTE — Telephone Encounter (Signed)
Left message return call 12/04/16 (test strips ordered)

## 2016-12-05 ENCOUNTER — Other Ambulatory Visit: Payer: Self-pay

## 2016-12-05 DIAGNOSIS — M9903 Segmental and somatic dysfunction of lumbar region: Secondary | ICD-10-CM | POA: Diagnosis not present

## 2016-12-05 DIAGNOSIS — M5430 Sciatica, unspecified side: Secondary | ICD-10-CM | POA: Diagnosis not present

## 2016-12-05 DIAGNOSIS — M9905 Segmental and somatic dysfunction of pelvic region: Secondary | ICD-10-CM | POA: Diagnosis not present

## 2016-12-05 DIAGNOSIS — M9904 Segmental and somatic dysfunction of sacral region: Secondary | ICD-10-CM | POA: Diagnosis not present

## 2016-12-05 MED ORDER — GLUCOSE BLOOD VI STRP: ORAL_STRIP | 5 refills | Status: DC

## 2016-12-05 NOTE — Telephone Encounter (Signed)
Pharmacist called - Please send new Rx to [facility] for pt's test strips  Be sure to include the diagnosis code (requirement for Medicare billing)

## 2016-12-06 DIAGNOSIS — M5430 Sciatica, unspecified side: Secondary | ICD-10-CM | POA: Diagnosis not present

## 2016-12-06 DIAGNOSIS — M9903 Segmental and somatic dysfunction of lumbar region: Secondary | ICD-10-CM | POA: Diagnosis not present

## 2016-12-06 DIAGNOSIS — M9904 Segmental and somatic dysfunction of sacral region: Secondary | ICD-10-CM | POA: Diagnosis not present

## 2016-12-06 DIAGNOSIS — M9905 Segmental and somatic dysfunction of pelvic region: Secondary | ICD-10-CM | POA: Diagnosis not present

## 2016-12-07 ENCOUNTER — Telehealth: Payer: Self-pay | Admitting: Family Medicine

## 2016-12-07 MED ORDER — GLUCOSE BLOOD VI STRP: ORAL_STRIP | 5 refills | Status: DC

## 2016-12-07 NOTE — Telephone Encounter (Signed)
Patient is needing Rx for one touch delica testing strips to [facility].  He is no longer using the Freestyle strips.

## 2016-12-07 NOTE — Telephone Encounter (Signed)
Prescription sent electronically to pharmacy. Patient notified. 

## 2016-12-10 ENCOUNTER — Telehealth: Payer: Self-pay | Admitting: Family Medicine

## 2016-12-10 ENCOUNTER — Other Ambulatory Visit: Payer: Self-pay | Admitting: Family Medicine

## 2016-12-10 DIAGNOSIS — M9904 Segmental and somatic dysfunction of sacral region: Secondary | ICD-10-CM | POA: Diagnosis not present

## 2016-12-10 DIAGNOSIS — M9903 Segmental and somatic dysfunction of lumbar region: Secondary | ICD-10-CM | POA: Diagnosis not present

## 2016-12-10 DIAGNOSIS — M9905 Segmental and somatic dysfunction of pelvic region: Secondary | ICD-10-CM | POA: Diagnosis not present

## 2016-12-10 DIAGNOSIS — M5430 Sciatica, unspecified side: Secondary | ICD-10-CM | POA: Diagnosis not present

## 2016-12-10 MED ORDER — GLUCOSE BLOOD VI STRP: ORAL_STRIP | 5 refills | Status: DC

## 2016-12-10 NOTE — Telephone Encounter (Signed)
Prescription resent to pharmacy. Patient notified. 

## 2016-12-10 NOTE — Telephone Encounter (Signed)
Patients spouse is requesting a call back from the nurse.  She requested Rx for diabetic test strips called in last week, but the pharmacy is telling her that they are not getting accurate info from [place] to fill these.

## 2016-12-11 DIAGNOSIS — M9904 Segmental and somatic dysfunction of sacral region: Secondary | ICD-10-CM | POA: Diagnosis not present

## 2016-12-11 DIAGNOSIS — M9905 Segmental and somatic dysfunction of pelvic region: Secondary | ICD-10-CM | POA: Diagnosis not present

## 2016-12-11 DIAGNOSIS — M5430 Sciatica, unspecified side: Secondary | ICD-10-CM | POA: Diagnosis not present

## 2016-12-11 DIAGNOSIS — M9903 Segmental and somatic dysfunction of lumbar region: Secondary | ICD-10-CM | POA: Diagnosis not present

## 2016-12-13 DIAGNOSIS — M9904 Segmental and somatic dysfunction of sacral region: Secondary | ICD-10-CM | POA: Diagnosis not present

## 2016-12-13 DIAGNOSIS — M5430 Sciatica, unspecified side: Secondary | ICD-10-CM | POA: Diagnosis not present

## 2016-12-13 DIAGNOSIS — M9903 Segmental and somatic dysfunction of lumbar region: Secondary | ICD-10-CM | POA: Diagnosis not present

## 2016-12-13 DIAGNOSIS — M9905 Segmental and somatic dysfunction of pelvic region: Secondary | ICD-10-CM | POA: Diagnosis not present

## 2016-12-17 DIAGNOSIS — M9905 Segmental and somatic dysfunction of pelvic region: Secondary | ICD-10-CM | POA: Diagnosis not present

## 2016-12-17 DIAGNOSIS — M5430 Sciatica, unspecified side: Secondary | ICD-10-CM | POA: Diagnosis not present

## 2016-12-17 DIAGNOSIS — M9903 Segmental and somatic dysfunction of lumbar region: Secondary | ICD-10-CM | POA: Diagnosis not present

## 2016-12-17 DIAGNOSIS — M9904 Segmental and somatic dysfunction of sacral region: Secondary | ICD-10-CM | POA: Diagnosis not present

## 2016-12-19 ENCOUNTER — Ambulatory Visit (INDEPENDENT_AMBULATORY_CARE_PROVIDER_SITE_OTHER): Payer: Medicare Other | Admitting: Family Medicine

## 2016-12-19 ENCOUNTER — Encounter: Payer: Self-pay | Admitting: Family Medicine

## 2016-12-19 VITALS — BP 154/74 | Ht 69.0 in | Wt 216.0 lb

## 2016-12-19 DIAGNOSIS — G629 Polyneuropathy, unspecified: Secondary | ICD-10-CM | POA: Diagnosis not present

## 2016-12-19 DIAGNOSIS — I10 Essential (primary) hypertension: Secondary | ICD-10-CM | POA: Diagnosis not present

## 2016-12-19 DIAGNOSIS — M9905 Segmental and somatic dysfunction of pelvic region: Secondary | ICD-10-CM | POA: Diagnosis not present

## 2016-12-19 DIAGNOSIS — M5432 Sciatica, left side: Secondary | ICD-10-CM | POA: Diagnosis not present

## 2016-12-19 DIAGNOSIS — M5430 Sciatica, unspecified side: Secondary | ICD-10-CM | POA: Diagnosis not present

## 2016-12-19 DIAGNOSIS — E119 Type 2 diabetes mellitus without complications: Secondary | ICD-10-CM

## 2016-12-19 DIAGNOSIS — M9904 Segmental and somatic dysfunction of sacral region: Secondary | ICD-10-CM | POA: Diagnosis not present

## 2016-12-19 DIAGNOSIS — M9903 Segmental and somatic dysfunction of lumbar region: Secondary | ICD-10-CM | POA: Diagnosis not present

## 2016-12-19 LAB — POCT GLYCOSYLATED HEMOGLOBIN (HGB A1C): HEMOGLOBIN A1C: 6.8

## 2016-12-19 NOTE — Progress Notes (Signed)
   Subjective:    Patient ID: Aaron Key, male    DOB: 1951/01/17, 66 y.o.   MRN: [id number]  Diabetes  He has type 2 diabetes mellitus. No MedicAlert identification noted. His disease course has been stable. Associated symptoms include foot paresthesias. Symptoms are stable. His weight is stable. He is following a generally healthy diet. He has not had a previous visit with a dietitian. He participates in exercise intermittently. He does not see a podiatrist.Eye exam is current.    Follow up on Pinched nerve per patient.  Pt had severe pain , xray showed vertebra sitting on a nerve, started feeling better daily, nine treatments have seemed to help. Notes substantial improvement in the neuropathic pain.    Blood pressure medicine and blood pressure levels reviewed today with patient. Compliant with blood pressure medicine. States does not miss a dose. No obvious side effects. Blood pressure generally good when checked elsewhere. Watching salt intake.   Patient claims compliance with diabetes medication. No obvious side effects. Reports no substantial low sugar spells. Most numbers are generally in good range when checked fasting. Generally does not miss a dose of medication. Watching diabetic diet closely  Ongoing challenges with sleep apnea. Feels C Pap device definitely helps.   . Review of Systems Results for orders placed or performed in visit on 12/19/16  POCT glycosylated hemoglobin (Hb A1C)  Result Value Ref Range   Hemoglobin A1C 6.8   No headache, no major weight loss or weight gain, no chest pain no back pain abdominal pain no change in bowel habits complete ROS otherwise negative      Objective:   Physical Exam  Alert and oriented, vitals reviewed and stable, NAD ENT-TM's and ext canals WNL bilat via otoscopic exam Soft palate, tonsils and post pharynx WNL via oropharyngeal exam Neck-symmetric, no masses; thyroid nonpalpable and nontender Pulmonary-no tachypnea or  accessory muscle use; Clear without wheezes via auscultation Card--no abnrml murmurs, rhythm reg and rate WNL Carotid pulses symmetric, without bruits       Assessment & Plan:  Impression 1 sciatica very long discussion held. Overall is calm down and interventions including physical therapy and medication a #2 hypertension good control discussed maintain same med 3 type 2 diabetes good control discussed maintain same #4 chronic sleep apnea ongoing discussed. Follow-up as scheduled. Diet exercise discussed

## 2016-12-24 DIAGNOSIS — M5430 Sciatica, unspecified side: Secondary | ICD-10-CM | POA: Diagnosis not present

## 2016-12-24 DIAGNOSIS — M9904 Segmental and somatic dysfunction of sacral region: Secondary | ICD-10-CM | POA: Diagnosis not present

## 2016-12-24 DIAGNOSIS — M9903 Segmental and somatic dysfunction of lumbar region: Secondary | ICD-10-CM | POA: Diagnosis not present

## 2016-12-24 DIAGNOSIS — M9905 Segmental and somatic dysfunction of pelvic region: Secondary | ICD-10-CM | POA: Diagnosis not present

## 2016-12-26 DIAGNOSIS — M5430 Sciatica, unspecified side: Secondary | ICD-10-CM | POA: Diagnosis not present

## 2016-12-26 DIAGNOSIS — M9905 Segmental and somatic dysfunction of pelvic region: Secondary | ICD-10-CM | POA: Diagnosis not present

## 2016-12-26 DIAGNOSIS — M9903 Segmental and somatic dysfunction of lumbar region: Secondary | ICD-10-CM | POA: Diagnosis not present

## 2016-12-26 DIAGNOSIS — M9904 Segmental and somatic dysfunction of sacral region: Secondary | ICD-10-CM | POA: Diagnosis not present

## 2016-12-31 DIAGNOSIS — M9904 Segmental and somatic dysfunction of sacral region: Secondary | ICD-10-CM | POA: Diagnosis not present

## 2016-12-31 DIAGNOSIS — M9905 Segmental and somatic dysfunction of pelvic region: Secondary | ICD-10-CM | POA: Diagnosis not present

## 2016-12-31 DIAGNOSIS — M9903 Segmental and somatic dysfunction of lumbar region: Secondary | ICD-10-CM | POA: Diagnosis not present

## 2016-12-31 DIAGNOSIS — M5430 Sciatica, unspecified side: Secondary | ICD-10-CM | POA: Diagnosis not present

## 2017-01-02 DIAGNOSIS — M9903 Segmental and somatic dysfunction of lumbar region: Secondary | ICD-10-CM | POA: Diagnosis not present

## 2017-01-02 DIAGNOSIS — M5430 Sciatica, unspecified side: Secondary | ICD-10-CM | POA: Diagnosis not present

## 2017-01-02 DIAGNOSIS — M9904 Segmental and somatic dysfunction of sacral region: Secondary | ICD-10-CM | POA: Diagnosis not present

## 2017-01-02 DIAGNOSIS — M9905 Segmental and somatic dysfunction of pelvic region: Secondary | ICD-10-CM | POA: Diagnosis not present

## 2017-01-16 DIAGNOSIS — M9904 Segmental and somatic dysfunction of sacral region: Secondary | ICD-10-CM | POA: Diagnosis not present

## 2017-01-16 DIAGNOSIS — M9903 Segmental and somatic dysfunction of lumbar region: Secondary | ICD-10-CM | POA: Diagnosis not present

## 2017-01-16 DIAGNOSIS — M9905 Segmental and somatic dysfunction of pelvic region: Secondary | ICD-10-CM | POA: Diagnosis not present

## 2017-01-16 DIAGNOSIS — M5430 Sciatica, unspecified side: Secondary | ICD-10-CM | POA: Diagnosis not present

## 2017-01-23 DIAGNOSIS — M5430 Sciatica, unspecified side: Secondary | ICD-10-CM | POA: Diagnosis not present

## 2017-01-23 DIAGNOSIS — M9904 Segmental and somatic dysfunction of sacral region: Secondary | ICD-10-CM | POA: Diagnosis not present

## 2017-01-23 DIAGNOSIS — M9903 Segmental and somatic dysfunction of lumbar region: Secondary | ICD-10-CM | POA: Diagnosis not present

## 2017-01-23 DIAGNOSIS — M9905 Segmental and somatic dysfunction of pelvic region: Secondary | ICD-10-CM | POA: Diagnosis not present

## 2017-03-13 ENCOUNTER — Ambulatory Visit (INDEPENDENT_AMBULATORY_CARE_PROVIDER_SITE_OTHER): Payer: Medicare Other | Admitting: Family Medicine

## 2017-03-13 ENCOUNTER — Encounter: Payer: Self-pay | Admitting: Family Medicine

## 2017-03-13 VITALS — BP 140/82 | Temp 98.5°F | Ht 69.0 in | Wt 218.0 lb

## 2017-03-13 DIAGNOSIS — J329 Chronic sinusitis, unspecified: Secondary | ICD-10-CM

## 2017-03-13 DIAGNOSIS — J310 Chronic rhinitis: Secondary | ICD-10-CM

## 2017-03-13 DIAGNOSIS — Z23 Encounter for immunization: Secondary | ICD-10-CM | POA: Diagnosis not present

## 2017-03-13 MED ORDER — HYDROCODONE-HOMATROPINE 5-1.5 MG/5ML PO SYRP: ORAL_SOLUTION | ORAL | 0 refills | Status: DC

## 2017-03-13 MED ORDER — AZITHROMYCIN 250 MG PO TABS: ORAL_TABLET | ORAL | 0 refills | Status: DC

## 2017-03-13 NOTE — Progress Notes (Signed)
   Subjective:    Patient ID: Aaron Key, male    DOB: [birth year], 66 y.o.   MRN: [id number]  Cough    Patient here today complaining of runny nose and chest congestion,cough non productive,stuffy nose, since Sunday evening.Taking Claritin,Robitussin, Flonase, which have helped some.  sund afternoon dranage and nose running  naggin and cough and dim eerngy   No fever   No gunkiness in the chest   Soreness behind the right ear, off and on recently   Using claritin prn mon and tue night  Took some nyquil day quil and flonase    Review of Systems  Respiratory: Positive for cough.        Objective:   Physical Exam Alert, mild malaise. Hydration good Vitals stable. frontal/ maxillary tenderness evident positive nasal congestion. pharynx normal neck supple  lungs clear/no crackles or wheezes. heart regular in rhythm        Assessment & Plan:  Impression rhinosinusitis likely post viral, discussed with patient. plan antibiotics prescribed. Questions answered. Symptomatic care discussed. warning signs discussed. WSL

## 2017-03-20 ENCOUNTER — Encounter: Payer: Self-pay | Admitting: Family Medicine

## 2017-03-20 ENCOUNTER — Ambulatory Visit (INDEPENDENT_AMBULATORY_CARE_PROVIDER_SITE_OTHER): Payer: Medicare Other | Admitting: Family Medicine

## 2017-03-20 VITALS — BP 138/80 | Temp 98.7°F | Ht 69.0 in | Wt 218.4 lb

## 2017-03-20 DIAGNOSIS — J329 Chronic sinusitis, unspecified: Secondary | ICD-10-CM | POA: Diagnosis not present

## 2017-03-20 MED ORDER — CEFDINIR 300 MG PO CAPS: 300.0000 mg | ORAL_CAPSULE | Freq: Two times a day (BID) | ORAL | 0 refills | Status: DC

## 2017-03-20 NOTE — Progress Notes (Signed)
   Subjective:    Patient ID: Aaron Key, male    DOB: [birth year], 66 y.o.   MRN: [id number]  Cough  This is a recurrent problem. The current episode started in the past 7 days. Associated symptoms include rhinorrhea. Associated symptoms comments: Loss of voice.   Patient states no other concerns this visit.  Feels congested and tight and wheezy and coughing in the chest  Dim energy   Coughing bad at night   Hoarse voice the past five days  Wife has allergies     Review of Systems  HENT: Positive for rhinorrhea.   Respiratory: Positive for cough.        Objective:   Physical Exam Alert, mild malaise. Hydration good Vitals stable. frontal/ maxillary tenderness evident positive nasal congestion. pharynx normal neck supple  lungs clear/no crackles or wheezes. heart regular in rhythm        Assessment & Plan:  Impression rhinosinusitis likely post viral, discussed with patient. plan antibiotics prescribed. Questions answered. Symptomatic care discussed. warning signs discussed. WSL

## 2017-03-21 ENCOUNTER — Ambulatory Visit: Payer: Medicare Other | Admitting: Family Medicine

## 2017-04-03 ENCOUNTER — Encounter: Payer: Self-pay | Admitting: Family Medicine

## 2017-04-03 ENCOUNTER — Ambulatory Visit (INDEPENDENT_AMBULATORY_CARE_PROVIDER_SITE_OTHER): Payer: Medicare Other | Admitting: Family Medicine

## 2017-04-03 VITALS — BP 158/80 | Temp 98.7°F | Ht 69.0 in | Wt 216.0 lb

## 2017-04-03 DIAGNOSIS — I10 Essential (primary) hypertension: Secondary | ICD-10-CM | POA: Diagnosis not present

## 2017-04-03 DIAGNOSIS — Z79899 Other long term (current) drug therapy: Secondary | ICD-10-CM

## 2017-04-03 DIAGNOSIS — G629 Polyneuropathy, unspecified: Secondary | ICD-10-CM | POA: Diagnosis not present

## 2017-04-03 DIAGNOSIS — G4733 Obstructive sleep apnea (adult) (pediatric): Secondary | ICD-10-CM | POA: Diagnosis not present

## 2017-04-03 DIAGNOSIS — Z125 Encounter for screening for malignant neoplasm of prostate: Secondary | ICD-10-CM | POA: Diagnosis not present

## 2017-04-03 DIAGNOSIS — J310 Chronic rhinitis: Secondary | ICD-10-CM

## 2017-04-03 DIAGNOSIS — J329 Chronic sinusitis, unspecified: Secondary | ICD-10-CM

## 2017-04-03 DIAGNOSIS — E785 Hyperlipidemia, unspecified: Secondary | ICD-10-CM | POA: Diagnosis not present

## 2017-04-03 DIAGNOSIS — E119 Type 2 diabetes mellitus without complications: Secondary | ICD-10-CM | POA: Diagnosis not present

## 2017-04-03 LAB — POCT GLYCOSYLATED HEMOGLOBIN (HGB A1C): Hemoglobin A1C: 7

## 2017-04-03 MED ORDER — CLARITHROMYCIN 500 MG PO TABS: 500.0000 mg | ORAL_TABLET | Freq: Two times a day (BID) | ORAL | 0 refills | Status: DC

## 2017-04-03 MED ORDER — LOSARTAN POTASSIUM 100 MG PO TABS: ORAL_TABLET | ORAL | 1 refills | Status: DC

## 2017-04-03 MED ORDER — GLIPIZIDE 5 MG PO TABS: ORAL_TABLET | ORAL | 1 refills | Status: DC

## 2017-04-03 MED ORDER — DAPAGLIFLOZIN PROPANEDIOL 10 MG PO TABS: 10.0000 mg | ORAL_TABLET | Freq: Every day | ORAL | 1 refills | Status: DC

## 2017-04-03 MED ORDER — OMEPRAZOLE 20 MG PO CPDR: 40.0000 mg | DELAYED_RELEASE_CAPSULE | Freq: Every day | ORAL | 1 refills | Status: DC

## 2017-04-03 MED ORDER — METFORMIN HCL 500 MG PO TABS: 1000.0000 mg | ORAL_TABLET | Freq: Two times a day (BID) | ORAL | 1 refills | Status: DC

## 2017-04-03 NOTE — Progress Notes (Signed)
   Subjective:    Patient ID: Aaron Key, male    DOB: [birth year], 66 y.o.   MRN: [id number]  Diabetes  He presents for his follow-up diabetic visit. He has type 2 diabetes mellitus. He is compliant with treatment all of the time. He is following a diabetic diet. He never participates in exercise. Home blood sugar record trend: up and down. He sees a podiatrist.Eye exam current: feb 2018.   Results for orders placed or performed in visit on 04/03/17  POCT glycosylated hemoglobin (Hb A1C)  Result Value Ref Range   Hemoglobin A1C 7.0     laryngitis for 4 weeks.    Patient claims compliance with diabetes medication. No obvious side effects. Reports no substantial low sugar spells. Most numbers are generally in good range when checked fasting. Generally does not miss a dose of medication. Watching diabetic diet closely  Blood pressure medicine and blood pressure levels reviewed today with patient. Compliant with blood pressure medicine. States does not miss a dose. No obvious side effects. Blood pressure generally good when checked elsewhere. Watching salt intake.   Patient has ongoing hoarse voice. Positive nasal congestion. Positive drainage. Positive cough worse at night. Worried about the drainage.  Also notes paresthesias and slight numbness distal feet. Plantar surface. Over the past year.        Review of Systems No headache, no major weight loss or weight gain, no chest pain no back pain abdominal pain no change in bowel habits complete ROS otherwise negative     Objective:   Physical Exam Alert and oriented, vitals reviewed and stable, NAD ENT-TM's and ext canalsPositive nasal congestion and positive hoarse voice WNL bilat via otoscopic exam Soft palate, tonsils and post pharynx WNL via oropharyngeal exam Neck-symmetric, no masses; thyroid nonpalpable and nontender Pulmonary-no tachypnea or accessory muscle use; Clear without wheezes via auscultation Card--no  abnrml murmurs, rhythm reg and rate WNL Carotid pulses symmetric, without bruits        Assessment & Plan:  Impression 1 type 2 diabetes control good though not awesome. Maximum dose and 3 current meds will maintain same regimen discussed  #2 hypertension good control discussed maintain same meds #3 persistent rhinosinusitis with substantial laryngitis. Long discussion held. Will give 14 days of Biaxin should fix  #4 sensory neuropathy discussed the need for medications at this time. Appropriate  Medications refilled

## 2017-05-07 ENCOUNTER — Ambulatory Visit (INDEPENDENT_AMBULATORY_CARE_PROVIDER_SITE_OTHER): Payer: Medicare Other | Admitting: Family Medicine

## 2017-05-07 ENCOUNTER — Encounter: Payer: Self-pay | Admitting: Family Medicine

## 2017-05-07 VITALS — BP 140/82 | Temp 98.0°F | Ht 69.0 in | Wt 219.2 lb

## 2017-05-07 DIAGNOSIS — R490 Dysphonia: Secondary | ICD-10-CM | POA: Diagnosis not present

## 2017-05-07 MED ORDER — AZELASTINE HCL 0.1 % NA SOLN: 1.0000 | Freq: Two times a day (BID) | NASAL | 12 refills | Status: DC

## 2017-05-07 NOTE — Progress Notes (Signed)
   Subjective:    Patient ID: Aaron Key, male    DOB: 1951-04-04, 66 y.o.   MRN: [id number]  Sore Throat   This is a recurrent problem. The current episode started more than 1 month ago. The problem has been unchanged. Associated symptoms include ear pain, a hoarse voice and neck pain. Associated symptoms comments: Runny nose, cough,. Treatments tried: Biaxin     Pt took both z pk and the  biaxin for fourteen days  Pt sill having sig hoarseness  Notes hreartburn a littr worse  Cough off and on . Uses halls for it        Review of Systems  HENT: Positive for ear pain and hoarse voice.   Musculoskeletal: Positive for neck pain.       Objective:   Physical Exam  Alert active good hydration HEENT mild nasal congestion pharynx normal.  Lungs clear.  Heart regular rate and rhythm.  Substantial hoarseness evident.  Throat exam within normal limits      Assessment & Plan:  Impression persistent hoarseness with some element of substantial post.  Plan add Astelin.  ENT referral

## 2017-05-08 ENCOUNTER — Ambulatory Visit: Payer: Medicare Other | Admitting: Family Medicine

## 2017-05-28 ENCOUNTER — Encounter: Payer: Self-pay | Admitting: Family Medicine

## 2017-05-28 ENCOUNTER — Ambulatory Visit (INDEPENDENT_AMBULATORY_CARE_PROVIDER_SITE_OTHER): Payer: Medicare Other | Admitting: Family Medicine

## 2017-05-28 VITALS — BP 148/88 | Temp 98.9°F | Ht 69.0 in | Wt 219.0 lb

## 2017-05-28 DIAGNOSIS — J4521 Mild intermittent asthma with (acute) exacerbation: Secondary | ICD-10-CM

## 2017-05-28 DIAGNOSIS — R053 Chronic cough: Secondary | ICD-10-CM

## 2017-05-28 DIAGNOSIS — R05 Cough: Secondary | ICD-10-CM

## 2017-05-28 MED ORDER — FLUTICASONE PROPIONATE HFA 44 MCG/ACT IN AERO: 2.0000 | INHALATION_SPRAY | Freq: Two times a day (BID) | RESPIRATORY_TRACT | 0 refills | Status: DC

## 2017-05-28 MED ORDER — DOXYCYCLINE HYCLATE 100 MG PO TABS: 100.0000 mg | ORAL_TABLET | Freq: Two times a day (BID) | ORAL | 0 refills | Status: DC

## 2017-05-28 MED ORDER — ALBUTEROL SULFATE HFA 108 (90 BASE) MCG/ACT IN AERS: 2.0000 | INHALATION_SPRAY | RESPIRATORY_TRACT | 0 refills | Status: DC | PRN

## 2017-05-28 NOTE — Progress Notes (Signed)
   Subjective:    Patient ID: Aaron Key, male    DOB: 1950/09/01, 66 y.o.   MRN: [id number] Patient arrives office with several concerns Sinusitis  This is a new problem. Episode onset: 2 and a half months. Associated symptoms include coughing, ear pain and a sore throat. (Wheezing) Treatments tried: antibiotics, hycodan, robitussin.   Quite frustrated coughing.  Now off and on for over 2 months.  No associated hemoptysis on occasion.  Definitely wheezing at times.  No history of asthma.  No history of smoking.  Positive remote smoking exposure.  No weight loss or weight gain.  There Coughing fair amnt at  In the mide of the night   Wheezing with cough  pord   No hx of asthma   Def wheezing the past weeks off and on  Continues to have intermittent hoarseness.  Has not seen ENT  Review of Systems  HENT: Positive for ear pain and sore throat.   Respiratory: Positive for cough.        Objective:   Physical Exam Alert and oriented, vitals reviewed and stable, NAD ENT-TM's and ext canals WNL bilat via otoscopic exam Soft palate, tonsils and post pharynx WNL via oropharyngeal exam Neck-symmetric, no masses; thyroid nonpalpable and nontender Pulmonary-no tachypnea or accessory muscle use; Diffuse positive mild wheezes via auscultation Card--no abnrml murmurs, rhythm reg and rate WNL Carotid pulses symmetric, without bruits        Assessment & Plan:  Impression 1 chronic reactive airway since respiratory illness over 2 months discussed at length/all notes reviewed  2.  Hemoptysis will check x-ray discussed  Albuterol proper use discussed and educated.  Flovent 2 puffs twice daily rationale discussed maintain number next month.  Antibiotics.  Further recommendations based on x-ray results  Greater than 50% of this 25 minute face to face visit was spent in counseling and discussion and coordination of care regarding the above diagnosis/diagnosies

## 2017-05-29 ENCOUNTER — Ambulatory Visit (HOSPITAL_COMMUNITY)
Admission: RE | Admit: 2017-05-29 | Discharge: 2017-05-29 | Disposition: A | Discharge status: Home / Self Care | Payer: Medicare Other | Source: Ambulatory Visit | Attending: Family Medicine | Admitting: Family Medicine

## 2017-05-29 ENCOUNTER — Telehealth: Payer: Self-pay | Admitting: Family Medicine

## 2017-05-29 ENCOUNTER — Other Ambulatory Visit: Payer: Self-pay | Admitting: *Deleted

## 2017-05-29 DIAGNOSIS — R05 Cough: Secondary | ICD-10-CM | POA: Diagnosis not present

## 2017-05-29 DIAGNOSIS — J398 Other specified diseases of upper respiratory tract: Secondary | ICD-10-CM | POA: Diagnosis not present

## 2017-05-29 MED ORDER — ALBUTEROL SULFATE HFA 108 (90 BASE) MCG/ACT IN AERS
2.0000 | INHALATION_SPRAY | RESPIRATORY_TRACT | 0 refills | Status: DC | PRN
Start: 2017-05-29 — End: 2017-09-05

## 2017-05-29 NOTE — Telephone Encounter (Signed)
Pt.notified

## 2017-05-29 NOTE — Telephone Encounter (Signed)
Rx Prior Auth for pt's PROAIR was DENIED  Per denial letter plan has Ventolin on the formulary  Please change to Ventolin & send to [facility] notify pt [phone]

## 2017-05-29 NOTE — Telephone Encounter (Signed)
Ventolin sent to [facility]. Left message on voicemail to return call

## 2017-06-05 ENCOUNTER — Encounter: Payer: Self-pay | Admitting: Family Medicine

## 2017-06-05 ENCOUNTER — Ambulatory Visit (INDEPENDENT_AMBULATORY_CARE_PROVIDER_SITE_OTHER): Payer: Medicare Other | Admitting: Family Medicine

## 2017-06-05 VITALS — BP 132/76 | Ht 69.0 in | Wt 217.0 lb

## 2017-06-05 DIAGNOSIS — E049 Nontoxic goiter, unspecified: Secondary | ICD-10-CM | POA: Diagnosis not present

## 2017-06-05 DIAGNOSIS — J398 Other specified diseases of upper respiratory tract: Secondary | ICD-10-CM

## 2017-06-05 NOTE — Progress Notes (Signed)
   Subjective:    Patient ID: Aaron Key, male    DOB: 04-Apr-1951, 66 y.o.   MRN: [id number]  HPIpt arrives today to discuss results of chest xray.  Please see prior notes.  Reports cough ongoing perhaps slightly better.  And on steroid inhaler well.  Notes ongoing fatigue wonders if it has not do with his thyroid difficulties.  Still due to see ENT  Enlarged thyroid gland.  Discovered via chest x-ray.  Deviated trachea.  No throat pain or tenderness      Review of Systems No headache, no major weight loss or weight gain, no chest pain no back pain abdominal pain no change in bowel habits complete ROS otherwise negative     Objective:   Physical Exam  Alert and oriented, vitals reviewed and stable, NAD ENT-TM's and ext canals WNL bilat via otoscopic exam Soft palate, tonsils and post pharynx WNL via oropharyngeal exam Neck-symmetric, no masses; thyroid nonpalpable and nontender Pulmonary-no tachypnea or accessory muscle use; Clear without wheezes via auscultation Card--no abnrml murmurs, rhythm reg and rate WNL Carotid pulses symmetric, without bruits Thyroid not distinctly palpable but there is a diffuse fullness in the thyroid region of the neck      Assessment & Plan:  Impression enlarged thyroid.  Recommend thyroid blood work.  Thyroid ultrasound.  Numerous questions answered.  Patient feels all of his symptoms may be due to this I did advise him he has other issues to  Further recommendations based on results  Greater than 50% of this 25 minute face to face visit was spent in counseling and discussion and coordination of care regarding the above diagnosis/diagnosies

## 2017-06-06 LAB — TSH: TSH: 2.82 u[IU]/mL (ref 0.450–4.500)

## 2017-06-06 LAB — T4: T4 TOTAL: 8.1 ug/dL (ref 4.5–12.0)

## 2017-06-10 ENCOUNTER — Other Ambulatory Visit: Payer: Self-pay | Admitting: Family Medicine

## 2017-06-12 ENCOUNTER — Telehealth: Payer: Self-pay | Admitting: Family Medicine

## 2017-06-12 DIAGNOSIS — R490 Dysphonia: Secondary | ICD-10-CM | POA: Diagnosis not present

## 2017-06-12 DIAGNOSIS — J3801 Paralysis of vocal cords and larynx, unilateral: Secondary | ICD-10-CM | POA: Diagnosis not present

## 2017-06-12 DIAGNOSIS — H6123 Impacted cerumen, bilateral: Secondary | ICD-10-CM | POA: Diagnosis not present

## 2017-06-12 NOTE — Telephone Encounter (Signed)
Requesting Rx for c-pap supplies to [facility]

## 2017-06-12 NOTE — Telephone Encounter (Signed)
Written awaiting signature.

## 2017-06-13 NOTE — Telephone Encounter (Signed)
Patient is aware 

## 2017-06-14 ENCOUNTER — Other Ambulatory Visit (INDEPENDENT_AMBULATORY_CARE_PROVIDER_SITE_OTHER): Payer: Self-pay | Admitting: Otolaryngology

## 2017-06-14 DIAGNOSIS — J3800 Paralysis of vocal cords and larynx, unspecified: Secondary | ICD-10-CM

## 2017-06-18 ENCOUNTER — Ambulatory Visit (INDEPENDENT_AMBULATORY_CARE_PROVIDER_SITE_OTHER): Payer: Medicare Other | Admitting: Family Medicine

## 2017-06-18 ENCOUNTER — Encounter: Payer: Self-pay | Admitting: Family Medicine

## 2017-06-18 VITALS — BP 118/68 | Temp 98.0°F | Ht 69.0 in | Wt 213.0 lb

## 2017-06-18 DIAGNOSIS — J398 Other specified diseases of upper respiratory tract: Secondary | ICD-10-CM | POA: Diagnosis not present

## 2017-06-18 DIAGNOSIS — G4733 Obstructive sleep apnea (adult) (pediatric): Secondary | ICD-10-CM

## 2017-06-18 DIAGNOSIS — R05 Cough: Secondary | ICD-10-CM | POA: Diagnosis not present

## 2017-06-18 DIAGNOSIS — R053 Chronic cough: Secondary | ICD-10-CM

## 2017-06-18 MED ORDER — BENZONATATE 100 MG PO CAPS: 100.0000 mg | ORAL_CAPSULE | Freq: Two times a day (BID) | ORAL | 0 refills | Status: DC | PRN

## 2017-06-18 NOTE — Progress Notes (Signed)
   Subjective:    Patient ID: Aaron Key, male    DOB: 01/16/1951, 67 y.o.   MRN: [id number]  HPI  Patient arrives with c/o chronic cough patient believes it is coming from reflux.  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  ent rec skipping the ultrasound and nthen do a ct scan  Pt feels experiencing more reflux   Now taking omeprazole 20 d   Notes obv acid  resp and symtoms from it   Never smoked in the pawt      having ongoin glayrngitis    Review of Systems No headache, no major weight loss or weight gain, no chest pain no back pain abdominal pain no change in bowel habits complete ROS otherwise negative     Objective:   Physical Exam Alert and oriented, vitals reviewed and stable, NAD ENT-TM's and ext canals WNL bilat via otoscopic exam Soft palate, tonsils and post pharynx WNL via oropharyngeal exam Neck-symmetric, no masses; thyroid nonpalpable and nontender Pulmonary-no tachypnea or accessory muscle use; Clear without wheezes via auscultation Card--no abnrml murmurs, rhythm reg and rate WNL Carotid pulses symmetric, without bruits        Assessment & Plan:  Impression chronic cough.  Very long discussion held with patient thinks reflux may be adding which could well be the case.  To increase back to his double omeprazole per day.  Also ongoing challenges with neck mass.  Due to have CT scans soon.  Dr. Lorelee Cover canceled the ultrasound and recommended this which I think is a good idea.  Very long discussion held.  May be a source of patient's vocal cord paralysis pressure.  I am hopeful this will be a benign process but we cannot be sure until we know for sure.  Greater than 50% of this 25 minute face to face visit was spent in counseling and discussion and coordination of care regarding the above diagnosis/diagnosies  Tessalon Perles also added for symptom care at patient request

## 2017-06-19 ENCOUNTER — Ambulatory Visit (HOSPITAL_COMMUNITY): Admission: RE | Admit: 2017-06-19 | Payer: Medicare Other | Source: Ambulatory Visit

## 2017-06-24 ENCOUNTER — Ambulatory Visit (HOSPITAL_COMMUNITY)
Admission: RE | Admit: 2017-06-24 | Discharge: 2017-06-24 | Disposition: A | Discharge status: Home / Self Care | Payer: Medicare Other | Source: Ambulatory Visit | Attending: Otolaryngology | Admitting: Otolaryngology

## 2017-06-24 DIAGNOSIS — J3800 Paralysis of vocal cords and larynx, unspecified: Secondary | ICD-10-CM | POA: Insufficient documentation

## 2017-06-24 DIAGNOSIS — C771 Secondary and unspecified malignant neoplasm of intrathoracic lymph nodes: Secondary | ICD-10-CM | POA: Diagnosis not present

## 2017-06-24 DIAGNOSIS — R221 Localized swelling, mass and lump, neck: Secondary | ICD-10-CM | POA: Insufficient documentation

## 2017-06-24 DIAGNOSIS — C7951 Secondary malignant neoplasm of bone: Secondary | ICD-10-CM | POA: Insufficient documentation

## 2017-06-24 LAB — POCT I-STAT CREATININE: CREATININE: 0.9 mg/dL (ref 0.61–1.24)

## 2017-06-24 MED ORDER — IOPAMIDOL (ISOVUE-300) INJECTION 61%
75.0000 mL | Freq: Once | INTRAVENOUS | Status: AC | PRN
  Administered 2017-06-24: 75 mL via INTRAVENOUS

## 2017-07-01 ENCOUNTER — Other Ambulatory Visit (INDEPENDENT_AMBULATORY_CARE_PROVIDER_SITE_OTHER): Payer: Self-pay | Admitting: Otolaryngology

## 2017-07-01 DIAGNOSIS — E041 Nontoxic single thyroid nodule: Secondary | ICD-10-CM

## 2017-07-03 ENCOUNTER — Ambulatory Visit (HOSPITAL_COMMUNITY)
Admission: RE | Admit: 2017-07-03 | Discharge: 2017-07-03 | Disposition: A | Discharge status: Home / Self Care | Payer: Medicare Other | Source: Ambulatory Visit | Attending: Otolaryngology | Admitting: Otolaryngology

## 2017-07-03 DIAGNOSIS — E041 Nontoxic single thyroid nodule: Secondary | ICD-10-CM | POA: Insufficient documentation

## 2017-07-04 ENCOUNTER — Other Ambulatory Visit (INDEPENDENT_AMBULATORY_CARE_PROVIDER_SITE_OTHER): Payer: Self-pay | Admitting: Otolaryngology

## 2017-07-04 DIAGNOSIS — R221 Localized swelling, mass and lump, neck: Secondary | ICD-10-CM

## 2017-07-05 ENCOUNTER — Ambulatory Visit (INDEPENDENT_AMBULATORY_CARE_PROVIDER_SITE_OTHER): Payer: Medicare Other | Admitting: Family Medicine

## 2017-07-05 ENCOUNTER — Encounter: Payer: Self-pay | Admitting: Family Medicine

## 2017-07-05 VITALS — BP 160/72 | Ht 69.0 in | Wt 209.0 lb

## 2017-07-05 DIAGNOSIS — F5102 Adjustment insomnia: Secondary | ICD-10-CM

## 2017-07-05 DIAGNOSIS — I10 Essential (primary) hypertension: Secondary | ICD-10-CM

## 2017-07-05 DIAGNOSIS — R221 Localized swelling, mass and lump, neck: Secondary | ICD-10-CM

## 2017-07-05 DIAGNOSIS — E785 Hyperlipidemia, unspecified: Secondary | ICD-10-CM | POA: Diagnosis not present

## 2017-07-05 DIAGNOSIS — E119 Type 2 diabetes mellitus without complications: Secondary | ICD-10-CM

## 2017-07-05 LAB — POCT GLYCOSYLATED HEMOGLOBIN (HGB A1C): Hemoglobin A1C: 6.2

## 2017-07-05 MED ORDER — ALPRAZOLAM ER 0.5 MG PO TB24: ORAL_TABLET | ORAL | 5 refills | Status: DC

## 2017-07-05 MED ORDER — GLUCOSE BLOOD VI STRP: ORAL_STRIP | 5 refills | Status: DC

## 2017-07-05 MED ORDER — ALPRAZOLAM 0.5 MG PO TABS: ORAL_TABLET | ORAL | 5 refills | Status: DC

## 2017-07-05 MED ORDER — GLIPIZIDE 5 MG PO TABS: ORAL_TABLET | ORAL | 1 refills | Status: DC

## 2017-07-05 NOTE — Progress Notes (Signed)
   Subjective:    Patient ID: Aaron Key, male    DOB: [birth year], 67 y.o.   MRN: [id number] Patient arrives office for follow-up of his many chronic health concerns, and now has a new major concern HPI Patient is here today with complaints that his blood sugars are going down at night.He takes glucotrol 2 tablets BID,and Glucophage 500 2 tablets BID,and Farxgia 10 mg Q am.He eats as healthy as he can.  Patient claims compliance with diabetes medication. No obvious side effects. Reports no substantial low sugar spells. Most numbers are generally in good range when checked fasting. Generally does not miss a dose of medication. Watching diabetic diet closely  Patient continues to take lipid medication regularly. No obvious side effects from it. Generally does not miss a dose. Prior blood work results are reviewed with patient. Patient continues to work on fat intake in diet  Blood pressure medicine and blood pressure levels reviewed today with patient. Compliant with blood pressure medicine. States does not miss a dose. No obvious side effects. Blood pressure generally good when checked elsewhere. Watching salt intake.   Understandably the most concerns of the patient right now is a results on his ongoing workup of hoarseness.  Dr. Lorelee Cover did a CT scan.  Unfortunately this shows fairly extensive involvement in the neck of a probable thyroid cancer, with substantial localized invasion and involvement of contiguous tissues.  There is even a question of pulmonary involvement, along with highly likely sternal lytic involvement  Patient notes that he now is having trouble sleeping after receiving this news.  He also wonders whether it is worthwhile to even go on and have all of this addressed  Review of Systems No headache, no major weight loss or weight gain, no chest pain no back pain abdominal pain no change in bowel habits complete ROS otherwise negative     Results for orders placed or  performed in visit on 07/05/17  POCT glycosylated hemoglobin (Hb A1C)  Result Value Ref Range   Hemoglobin A1C 6.2     Objective:   Physical Exam Alert and oriented, vitals reviewed and stable, NAD ENT-TM's and ext canals WNL bilat via otoscopic exam Soft palate, tonsils and post pharynx WNL via oropharyngeal exam Neck-symmetric, no masses; thyroid nonpalpable and nontender Pulmonary-no tachypnea or accessory muscle use; Clear without wheezes via auscultation Card--no abnrml murmurs, rhythm reg and rate WNL Carotid pulses symmetric, without bruits Neck fullness evident       Assessment & Plan:  Impression 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  In midst of workup.  Dr. Lorelee Cover to do a lymph node biopsy soon.  Also referring patient to [facility] for more tertiary care approach to his problem.  I certainly understand his frustration and fears.  Will initiate Xanax use at night to help him rest.  Definitely feel he should press on with aggressive treatment.  I advised him even if they cannot cure this they can do substantial interventions which will help him live longer and improve his quality of life.  2.  Type 2 diabetes good control discussed to maintain same  3.  Hyperlipidemia prior blood work discussed maintain same meds diet exercise discussed  4.  Hypertension good control discussed encouraged to maintain same  Follow-up as scheduled.  Greater than 50% of this 40 minute face to face visit was spent in counseling and discussion and coordination of care regarding the above diagnosis/diagnosies

## 2017-07-08 ENCOUNTER — Telehealth: Payer: Self-pay | Admitting: Family Medicine

## 2017-07-08 MED ORDER — PANTOPRAZOLE SODIUM 40 MG PO TBEC: 40.0000 mg | DELAYED_RELEASE_TABLET | Freq: Every day | ORAL | 5 refills | Status: DC

## 2017-07-08 NOTE — Telephone Encounter (Signed)
Prescription sent electronically to pharmacy. Patient notified. 

## 2017-07-08 NOTE — Telephone Encounter (Signed)
Patient is having trouble swallowing his omeprazole.  His pharmacy recommended Pantoprazole because it is much smaller and he wants to know if this can be changed.   [place]

## 2017-07-08 NOTE — Telephone Encounter (Signed)
Change to protonix 40 one p io qam six mo worth

## 2017-07-09 ENCOUNTER — Encounter (HOSPITAL_COMMUNITY): Payer: Self-pay | Admitting: Emergency Medicine

## 2017-07-09 ENCOUNTER — Other Ambulatory Visit: Payer: Self-pay

## 2017-07-09 ENCOUNTER — Emergency Department (HOSPITAL_COMMUNITY)
Admission: EM | Admit: 2017-07-09 | Discharge: 2017-07-09 | Disposition: A | Discharge status: Home / Self Care | Payer: Medicare Other | Attending: Emergency Medicine | Admitting: Emergency Medicine

## 2017-07-09 DIAGNOSIS — R042 Hemoptysis: Secondary | ICD-10-CM | POA: Diagnosis not present

## 2017-07-09 DIAGNOSIS — I10 Essential (primary) hypertension: Secondary | ICD-10-CM | POA: Diagnosis not present

## 2017-07-09 DIAGNOSIS — Z7982 Long term (current) use of aspirin: Secondary | ICD-10-CM | POA: Insufficient documentation

## 2017-07-09 DIAGNOSIS — E119 Type 2 diabetes mellitus without complications: Secondary | ICD-10-CM | POA: Insufficient documentation

## 2017-07-09 DIAGNOSIS — Z79899 Other long term (current) drug therapy: Secondary | ICD-10-CM | POA: Insufficient documentation

## 2017-07-09 DIAGNOSIS — R059 Cough, unspecified: Secondary | ICD-10-CM

## 2017-07-09 DIAGNOSIS — R05 Cough: Secondary | ICD-10-CM

## 2017-07-09 DIAGNOSIS — Z7984 Long term (current) use of oral hypoglycemic drugs: Secondary | ICD-10-CM | POA: Insufficient documentation

## 2017-07-09 HISTORY — DX: Other diseases of pharynx: J39.2

## 2017-07-09 HISTORY — DX: Localized swelling, mass and lump, neck: R22.1

## 2017-07-09 LAB — BASIC METABOLIC PANEL
Anion gap: 11 (ref 5–15)
BUN: 19 mg/dL (ref 6–20)
CHLORIDE: 99 mmol/L — AB (ref 101–111)
CO2: 26 mmol/L (ref 22–32)
Calcium: 9.5 mg/dL (ref 8.9–10.3)
Creatinine, Ser: 1.01 mg/dL (ref 0.61–1.24)
GFR calc Af Amer: >60 mL/min (ref >60)
GFR calc non Af Amer: >60 mL/min (ref >60)
GLUCOSE: 159 mg/dL — AB (ref 65–99)
Potassium: 3.7 mmol/L (ref 3.5–5.1)
Sodium: 136 mmol/L (ref 135–145)

## 2017-07-09 LAB — CBC WITH DIFFERENTIAL/PLATELET
Basophils Absolute: 0 10*3/uL (ref 0.0–0.1)
Basophils Relative: 0 %
EOS PCT: 5 %
Eosinophils Absolute: 0.4 10*3/uL (ref 0.0–0.7)
HEMATOCRIT: 41.9 % (ref 39.0–52.0)
HEMOGLOBIN: 13.5 g/dL (ref 13.0–17.0)
LYMPHS ABS: 1.8 10*3/uL (ref 0.7–4.0)
LYMPHS PCT: 19 %
MCH: 26.7 pg (ref 26.0–34.0)
MCHC: 32.2 g/dL (ref 30.0–36.0)
MCV: 83 fL (ref 78.0–100.0)
Monocytes Absolute: 0.8 10*3/uL (ref 0.1–1.0)
Monocytes Relative: 9 %
NEUTROS ABS: 6.3 10*3/uL (ref 1.7–7.7)
Neutrophils Relative %: 67 %
Platelets: 293 10*3/uL (ref 150–400)
RBC: 5.05 MIL/uL (ref 4.22–5.81)
RDW: 14.7 % (ref 11.5–15.5)
WBC: 9.4 10*3/uL (ref 4.0–10.5)

## 2017-07-09 NOTE — Discharge Instructions (Signed)
Stop taking the aspirin.  Make sure that you are drinking plenty of water each day, 1-2 L.  Follow-up for testing and oncology evaluation as planned.  Return here if needed for problems.

## 2017-07-09 NOTE — ED Triage Notes (Signed)
Patient reports he has a cancerous mass in his throat, has had cough for 4 months. Patient states for the past couple of days he has been coughing up blood. Today the blood has been more frequent and frank.

## 2017-07-09 NOTE — ED Provider Notes (Signed)
[facility] EMERGENCY DEPARTMENT Provider Note   CSN: [id number] Arrival date & time: 07/09/17  2028     History   Chief Complaint Chief Complaint  Patient presents with  . Hemoptysis    HPI Aaron Key is a 67 y.o. male.  He is here for evaluation of cough with hemoptysis.  The cough is been persistent, the hemoptysis has been intermittent.  Onset of symptoms several months ago.  He was treated by his PCP several times with antibiotics, and ultimately was seen by ENT, because of tracheal deviation.  He had an upper airway endoscopy, without findings.  CT scan was done, 2 weeks ago that showed right neck mass, likely thyroid, with malignant appearing adenopathy, bilateral neck.  He is scheduled for outpatient biopsy of mass and/or lymph nodes, in 3 days.  He is concerned because he is now coughing frank blood.  He admits to "coughing hard."  He denies chest pain, weakness or dizziness.  He is on a baby aspirin each day, as prophylaxis, for many years.  No long-standing history of asthma or bronchitis.  He is not a smoker.  He is here with a family member.  There are no other known modifying factors.  HPI  Past Medical History:  Diagnosis Date  . Anxiety    chronic  . Hypertension   . Microproteinuria   . Sleep apnea   . Throat mass   . Type 2 diabetes mellitus [facility])     Patient Active Problem List   Diagnosis Date Noted  . Type II or unspecified type diabetes mellitus with neurological manifestations, not stated as uncontrolled(250.60) 12/24/2012  . Essential hypertension, benign 12/24/2012  . Hyperlipidemia LDL goal <100 12/24/2012  . Diabetes ([place]) 12/24/2012  . Obstructive sleep apnea 12/24/2012  . Esophageal reflux 12/24/2012    Past Surgical History:  Procedure Laterality Date  . cystoscope         Home Medications    Prior to Admission medications   Medication Sig Start Date End Date Taking? Authorizing Provider  albuterol (PROVENTIL HFA;VENTOLIN  HFA) 108 (90 Base) MCG/ACT inhaler Inhale 2 puffs into the lungs every 4 (four) hours as needed for wheezing or shortness of breath. 05/29/17  Yes Mikey Kirschner, MD  ALPRAZolam Duanne Moron) 0.5 MG tablet One po QHS prn Sleep Patient taking differently: Take 0.5 mg by mouth at bedtime as needed for sleep. One po QHS prn Sleep 07/05/17  Yes Mikey Kirschner, MD  aspirin 81 MG tablet Take 81 mg by mouth daily.   Yes [provider]  azelastine (ASTELIN) 0.1 % nasal spray Place 1 spray into both nostrils 2 (two) times daily. Use in each nostril as directed Patient taking differently: Place 1 spray into both nostrils daily as needed. Use in each nostril as directed 05/07/17  Yes Mikey Kirschner, MD  dapagliflozin propanediol (FARXIGA) 10 MG TABS tablet Take 10 mg by mouth daily. 04/03/17  Yes Mikey Kirschner, MD  fluticasone (FLOVENT HFA) 44 MCG/ACT inhaler Inhale 2 puffs into the lungs 2 (two) times daily. 05/28/17  Yes Mikey Kirschner, MD  glipiZIDE (GLUCOTROL) 5 MG tablet TAKE ONE TABLET BY MOUTH 2 TIMES DAILY Patient taking differently: Take 5 mg by mouth daily.  07/05/17  Yes Mikey Kirschner, MD  ibuprofen (ADVIL,MOTRIN) 200 MG tablet Take 400 mg by mouth 3 (three) times daily.   Yes [provider]  losartan (COZAAR) 100 MG tablet TAKE ONE (1) TABLET EACH DAY 04/03/17  Yes Mikey Kirschner, MD  metFORMIN (GLUCOPHAGE) 500 MG tablet Take 2 tablets (1,000 mg total) by mouth 2 (two) times daily. 04/03/17  Yes Mikey Kirschner, MD  pantoprazole (PROTONIX) 40 MG tablet Take 1 tablet (40 mg total) by mouth daily. 07/08/17 01/04/18 Yes Mikey Kirschner, MD  benzonatate (TESSALON) 100 MG capsule Take 1 capsule (100 mg total) by mouth 2 (two) times daily as needed for cough. Patient not taking: Reported on 07/09/2017 06/18/17   Mikey Kirschner, MD  doxycycline (VIBRA-TABS) 100 MG tablet Take 1 tablet (100 mg total) by mouth 2 (two) times daily. Patient not taking: Reported on  07/09/2017 05/28/17   Mikey Kirschner, MD  glucose blood test strip Test two times a day- E11.9 07/05/17   Mikey Kirschner, MD    Family History Family History  Problem Relation Age of Onset  . Diabetes Mother        borderline  . Cancer Paternal Aunt        breast  . Heart attack Paternal Uncle   . Colon cancer Neg Hx     Social History Social History   Tobacco Use  . Smoking status: Never Smoker  . Smokeless tobacco: Never Used  Substance Use Topics  . Alcohol use: No    Alcohol/week: 0.0 oz    Frequency: Never  . Drug use: No     Allergies   Ace inhibitors and Penicillins   Review of Systems Review of Systems  All other systems reviewed and are negative.    Physical Exam Updated Vital Signs BP (!) 165/86 (BP Location: Right Arm)   Pulse 92   Temp 99.2 F (37.3 C) (Oral)   Resp 20   Ht 5\' 9"  (1.753 m)   Wt 94.8 kg (209 lb)   SpO2 99%   BMI 30.86 kg/m   Physical Exam  Constitutional: He is oriented to person, place, and time. He appears well-developed and well-nourished. No distress.  HENT:  Head: Normocephalic and atraumatic.  Right Ear: External ear normal.  Left Ear: External ear normal.  Eyes: Conjunctivae and EOM are normal. Pupils are equal, round, and reactive to light.  Neck: Normal range of motion and phonation normal. Neck supple. No tracheal deviation present.  There is fullness to the neck right greater than left, without distinct palpable mass, or adenopathy.  There is no stridor  Cardiovascular: Normal rate, regular rhythm and normal heart sounds.  Pulmonary/Chest: Effort normal and breath sounds normal. He exhibits no bony tenderness.  Musculoskeletal: Normal range of motion.  Neurological: He is alert and oriented to person, place, and time. No cranial nerve deficit or sensory deficit. He exhibits normal muscle tone. Coordination normal.  Skin: Skin is warm, dry and intact.  Psychiatric: He has a normal mood and affect. His behavior  is normal. Judgment and thought content normal.  Nursing note and vitals reviewed.    ED Treatments / Results  Labs (all labs ordered are listed, but only abnormal results are displayed) Labs Reviewed  CBC WITH DIFFERENTIAL/PLATELET  BASIC METABOLIC PANEL    EKG  EKG Interpretation None       Radiology No results found.  Procedures Procedures (including critical care time)  Medications Ordered in ED Medications - No data to display   Initial Impression / Assessment and Plan / ED Course  I have reviewed the triage vital signs and the nursing notes.  Pertinent labs & imaging results that were available during my care of  the patient were reviewed by me and considered in my medical decision making (see chart for details).      Patient Vitals for the past 24 hrs:  BP Temp Temp src Pulse Resp SpO2 Height Weight  07/09/17 2038 - - - - - - 5\' 9"  (1.753 m) 94.8 kg (209 lb)  07/09/17 2035 (!) 165/86 99.2 F (37.3 C) Oral 92 20 99 % - -    10:05 PM Reevaluation with update and discussion. After initial assessment and treatment, an updated evaluation reveals no change in clinical status.  Findings discussed with patient and family member, they agree with plan as follows.  Labs checked for baseline testing, since no CBC is on the chart.  This will likely be beneficial prior to the biopsy which will be done in 3 days.  All questions answered.Daleen Bo      Final Clinical Impressions(s) / ED Diagnoses   Final diagnoses:  Cough  Hemoptysis    Cough, likely related to thyroid mass and adenopathy.  Imaging report from mass reviewed, there is no submucosal or mucosal component to the mass.  Therefore it is unlikely that he will have a significant bleeding issue associated with his cough.  Cough itself is likely irritant because of the location of the mass near the airway.  I think that the bleeding is likely related to persistent cough, and straining with coughing.  He has  short-term scheduled follow-up for biopsy, and oncologic evaluation on July 16, 2017.  There is no indication for further evaluation or intervention at this time.  The patient does take aspirin and it is not necessary to continue that at this time, as it will likely tend to cause more bleeding.  Nursing Notes Reviewed/ Care Coordinated Applicable Imaging Reviewed Interpretation of Laboratory Data incorporated into ED treatment  The patient appears reasonably screened and/or stabilized for discharge and I doubt any other medical condition or other [facility] requiring further screening, evaluation, or treatment in the ED at this time prior to discharge.  Plan: Home Medications-stop aspirin continue regular medications; Home Treatments-try to avoid coughing; return here if the recommended treatment, does not improve the symptoms; Recommended follow up-follow-up as planned for biopsy and oncology evaluation   ED Discharge Orders    None       Daleen Bo, MD 07/09/17 2232

## 2017-07-09 NOTE — ED Notes (Signed)
Patient has a biopsy scheduled for Friday at [facility].

## 2017-07-10 ENCOUNTER — Other Ambulatory Visit: Payer: Self-pay | Admitting: Radiology

## 2017-07-12 ENCOUNTER — Other Ambulatory Visit (INDEPENDENT_AMBULATORY_CARE_PROVIDER_SITE_OTHER): Payer: Self-pay | Admitting: Otolaryngology

## 2017-07-12 ENCOUNTER — Encounter (HOSPITAL_COMMUNITY): Payer: Self-pay

## 2017-07-12 ENCOUNTER — Ambulatory Visit (HOSPITAL_COMMUNITY)
Admission: RE | Admit: 2017-07-12 | Discharge: 2017-07-12 | Disposition: A | Discharge status: Home / Self Care | Payer: Medicare Other | Source: Ambulatory Visit | Attending: Otolaryngology | Admitting: Otolaryngology

## 2017-07-12 DIAGNOSIS — G4733 Obstructive sleep apnea (adult) (pediatric): Secondary | ICD-10-CM | POA: Insufficient documentation

## 2017-07-12 DIAGNOSIS — R221 Localized swelling, mass and lump, neck: Secondary | ICD-10-CM | POA: Diagnosis present

## 2017-07-12 DIAGNOSIS — C770 Secondary and unspecified malignant neoplasm of lymph nodes of head, face and neck: Secondary | ICD-10-CM | POA: Insufficient documentation

## 2017-07-12 DIAGNOSIS — I10 Essential (primary) hypertension: Secondary | ICD-10-CM | POA: Diagnosis not present

## 2017-07-12 DIAGNOSIS — E785 Hyperlipidemia, unspecified: Secondary | ICD-10-CM | POA: Insufficient documentation

## 2017-07-12 DIAGNOSIS — Z7982 Long term (current) use of aspirin: Secondary | ICD-10-CM | POA: Insufficient documentation

## 2017-07-12 DIAGNOSIS — E119 Type 2 diabetes mellitus without complications: Secondary | ICD-10-CM | POA: Insufficient documentation

## 2017-07-12 DIAGNOSIS — Z7984 Long term (current) use of oral hypoglycemic drugs: Secondary | ICD-10-CM | POA: Diagnosis not present

## 2017-07-12 DIAGNOSIS — Z880 Allergy status to penicillin: Secondary | ICD-10-CM | POA: Diagnosis not present

## 2017-07-12 DIAGNOSIS — C801 Malignant (primary) neoplasm, unspecified: Secondary | ICD-10-CM | POA: Diagnosis not present

## 2017-07-12 DIAGNOSIS — F419 Anxiety disorder, unspecified: Secondary | ICD-10-CM | POA: Diagnosis not present

## 2017-07-12 DIAGNOSIS — G4730 Sleep apnea, unspecified: Secondary | ICD-10-CM | POA: Diagnosis not present

## 2017-07-12 DIAGNOSIS — R590 Localized enlarged lymph nodes: Secondary | ICD-10-CM | POA: Diagnosis not present

## 2017-07-12 DIAGNOSIS — K219 Gastro-esophageal reflux disease without esophagitis: Secondary | ICD-10-CM | POA: Insufficient documentation

## 2017-07-12 DIAGNOSIS — Z79899 Other long term (current) drug therapy: Secondary | ICD-10-CM | POA: Diagnosis not present

## 2017-07-12 LAB — APTT: aPTT: 28 seconds (ref 24–36)

## 2017-07-12 LAB — PROTIME-INR
INR: 1
Prothrombin Time: 13.1 seconds (ref 11.4–15.2)

## 2017-07-12 LAB — GLUCOSE, CAPILLARY: GLUCOSE-CAPILLARY: 117 mg/dL — AB (ref 65–99)

## 2017-07-12 MED ORDER — MIDAZOLAM HCL 2 MG/2ML IJ SOLN
INTRAMUSCULAR | Status: AC | PRN
  Administered 2017-07-12 (×2): 1 mg via INTRAVENOUS

## 2017-07-12 MED ORDER — FENTANYL CITRATE (PF) 100 MCG/2ML IJ SOLN
INTRAMUSCULAR | Status: AC | PRN
  Administered 2017-07-12 (×2): 50 ug via INTRAVENOUS

## 2017-07-12 MED ORDER — LIDOCAINE-EPINEPHRINE 1 %-1:100000 IJ SOLN
INTRAMUSCULAR | Status: AC
  Filled 2017-07-12: qty 1

## 2017-07-12 MED ORDER — SODIUM CHLORIDE 0.9 % IV SOLN
INTRAVENOUS | Status: DC
  Administered 2017-07-12: 08:00:00 via INTRAVENOUS

## 2017-07-12 MED ORDER — MIDAZOLAM HCL 2 MG/2ML IJ SOLN
INTRAMUSCULAR | Status: AC
  Filled 2017-07-12: qty 4

## 2017-07-12 MED ORDER — FENTANYL CITRATE (PF) 100 MCG/2ML IJ SOLN
INTRAMUSCULAR | Status: AC
  Filled 2017-07-12: qty 4

## 2017-07-12 NOTE — H&P (Signed)
Chief Complaint: Patient was seen in consultation today for right neck mass biopsy at the request of Teoh,Su  Referring Physician(s): Teoh,Su  Supervising Physician: Sandi Mariscal  Patient Status: [facility] - Out-pt  History of Present Illness: Aaron Key is a 67 y.o. 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 Probable vascular encasement on the right. This lesion quite probably affects the recurrent laryngeal nerve on the right. Extensive metastatic adenopathy within both sides of the neck with index nodes as measured above. Extensive mediastinal lymphadenopathy. Irregular pulmonary densities in the upper lobes worrisome for early pulmonary metastatic disease. 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  And [place] 1/23: IMPRESSION: Thyroid ultrasound again demonstrates infiltrative process involving the right thyroid with extrathyroid extension and bilateral head and neck pathologic lymph nodes. Referral for biopsy is recommended. Leading differential diagnosis is malignancy, potentially thyroid carcinoma or possibly lymphoma.  Now scheduled for right neck mass biopsy  Past Medical History:  Diagnosis Date  . Anxiety    chronic  . Hypertension   . Microproteinuria   . Sleep apnea   . Throat mass   . Type 2 diabetes mellitus ([place])     Past Surgical History:  Procedure Laterality Date  . cystoscope      Allergies: Ace inhibitors and Penicillins  Medications: Prior to Admission medications   Medication Sig Start Date End Date Taking? Authorizing Provider  albuterol (PROVENTIL  HFA;VENTOLIN HFA) 108 (90 Base) MCG/ACT inhaler Inhale 2 puffs into the lungs every 4 (four) hours as needed for wheezing or shortness of breath. 05/29/17  Yes Mikey Kirschner, MD  ALPRAZolam Duanne Moron) 0.5 MG tablet One po QHS prn Sleep Patient taking differently: Take 0.5 mg by mouth at bedtime as needed for sleep. One po QHS prn Sleep 07/05/17  Yes Mikey Kirschner, MD  aspirin 81 MG tablet Take 81 mg by mouth daily.   Yes [provider]  azelastine (ASTELIN) 0.1 % nasal spray Place 1 spray into both nostrils 2 (two) times daily. Use in each nostril as directed Patient taking differently: Place 1 spray into both nostrils daily as needed. Use in each nostril as directed 05/07/17  Yes Mikey Kirschner, MD  benzonatate (TESSALON) 100 MG capsule Take 1 capsule (100 mg total) by mouth 2 (two) times daily as needed for cough. 06/18/17  Yes Mikey Kirschner, MD  dapagliflozin propanediol (FARXIGA) 10 MG TABS tablet Take 10 mg by mouth daily. 04/03/17  Yes Mikey Kirschner, MD  fluticasone (FLOVENT HFA) 44 MCG/ACT inhaler Inhale 2 puffs into the lungs 2 (two) times daily. 05/28/17  Yes Mikey Kirschner, MD  glipiZIDE (GLUCOTROL) 5 MG tablet TAKE ONE TABLET BY MOUTH 2 TIMES DAILY Patient taking differently: Take 5 mg by mouth daily.  07/05/17  Yes Mikey Kirschner, MD  glucose blood test strip Test two times a day- E11.9 07/05/17  Yes Mikey Kirschner, MD  ibuprofen (ADVIL,MOTRIN) 200 MG tablet Take 400 mg by mouth 3 (three) times daily.   Yes [provider]  losartan (COZAAR) 100 MG tablet TAKE ONE (1) TABLET EACH DAY 04/03/17  Yes Mikey Kirschner, MD  metFORMIN (GLUCOPHAGE) 500 MG tablet Take 2 tablets (1,000 mg total) by mouth 2 (two) times daily. 04/03/17  Yes Mikey Kirschner, MD  pantoprazole (PROTONIX) 40 MG tablet Take 1 tablet (40 mg total) by mouth daily. 07/08/17 01/04/18 Yes Mikey Kirschner, MD  doxycycline (VIBRA-TABS) 100 MG tablet Take 1 tablet (100 mg total) by mouth  2 (two) times daily. 05/28/17   Mikey Kirschner, MD     Family History  Problem Relation Age of Onset  . Diabetes Mother        borderline  . Cancer Paternal Aunt        breast  . Heart attack Paternal Uncle   . Colon cancer Neg Hx     Social History   Socioeconomic History  . Marital status: Single    Spouse name: None  . Number of children: None  . Years of education: None  . Highest education level: None  Social Needs  . Financial resource strain: None  . Food insecurity - worry: None  . Food insecurity - inability: None  . Transportation needs - medical: None  . Transportation needs - non-medical: None  Occupational History  . None  Tobacco Use  . Smoking status: Never Smoker  . Smokeless tobacco: Never Used  Substance and Sexual Activity  . Alcohol use: No    Alcohol/week: 0.0 oz    Frequency: Never  . Drug use: No  . Sexual activity: None  Other Topics Concern  . None  Social History Narrative  . None    Review of Systems: A 12 point ROS discussed and pertinent positives are indicated in the HPI above.  All other systems are negative.  Review of Systems  Constitutional: Positive for activity change, appetite change and fatigue. Negative for unexpected weight change.  HENT: Positive for trouble swallowing. Negative for sore throat.   Respiratory: Positive for cough. Negative for choking and shortness of breath.   Gastrointestinal: Negative for abdominal pain.  Neurological: Negative for weakness.  Psychiatric/Behavioral: Negative for behavioral problems and confusion.    Vital Signs: BP (!) 142/74   Pulse 88   Temp 98.3 F (36.8 C) (Oral)   Resp 16   Ht 5\' 9"  (1.753 m)   Wt 205 lb (93 kg)   SpO2 99%   BMI 30.27 kg/m   Physical Exam  Constitutional: He is oriented to person, place, and time.  Cardiovascular: Normal rate, regular rhythm and normal heart sounds.  Pulmonary/Chest: Effort normal and breath sounds normal.  Abdominal: Soft. Bowel  sounds are normal.  Musculoskeletal: Normal range of motion.  Neurological: He is alert and oriented to person, place, and time.  Skin: Skin is warm and dry.  Psychiatric: He has a normal mood and affect. His behavior is normal. Judgment and thought content normal.  Nursing note and vitals reviewed.   Imaging: Ct Soft Tissue Neck W Contrast  Result Date: 06/24/2017 CLINICAL DATA:  Right-sided vocal cord paralysis. EXAM: CT NECK WITH CONTRAST TECHNIQUE: Multidetector CT imaging of the neck was performed using the standard protocol following the bolus administration of intravenous contrast. CONTRAST:  21mL ISOVUE-300 IOPAMIDOL (ISOVUE-300) INJECTION 61% COMPARISON:  None. FINDINGS: I think the primary finding in this case is that of a large invasive mass of the right lower neck probably arising from the right lobe of the thyroid. This measures at least 6 cm in diameter and likely affects the recurrent laryngeal nerve on the right explaining the right-sided vocal cord paralysis. The tumor  at least partially encases the right carotid artery and the right jugular vein. There is extensive metastatic lymphadenopathy on both sides of the neck. Index partially necrotic level 2 node on the right image 44 shows short axis dimension of 17 mm. Level 3 node on the right image 75 shows a short axis diameter of 2 cm. Index necrotic level 2 node on the left image 50 shows short axis diameter of 15 mm. Index superior mediastinal pretracheal node image 128 measures 19 mm in diameter. Multiple other superior mediastinal and bilateral neck nodes. I do not see a mucosal or submucosal lesion. The glottis is closed and I cannot rule out a primary glottic tumor, but presume that we are dealing with a right thyroid primary malignancy. Parotid and submandibular glands are normal. There is a lytic metastasis to the manubrium of the sternum. No other lytic bone lesion is seen. Lung apices show some irregular densities that could  represent pulmonary metastatic disease. 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 Probable vascular encasement on the right. This lesion quite probably affects the recurrent laryngeal nerve on the right. Extensive metastatic adenopathy within both sides of the neck with index nodes as measured above. Extensive mediastinal lymphadenopathy. Irregular pulmonary densities in the upper lobes worrisome for early pulmonary metastatic disease. 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 Electronically Signed   By: Nelson Chimes M.D.   On: 06/24/2017 17:15   US Thyroid  Result Date: 07/03/2017 CLINICAL DATA:  67 year old male with a history of thyroid nodule EXAM: THYROID ULTRASOUND TECHNIQUE: Ultrasound examination of the thyroid gland and adjacent soft tissues was performed. COMPARISON:  CT neck 06/24/2017 FINDINGS: Parenchymal Echotexture: Markedly heterogenous Isthmus: 1.0 cm Right lobe: 6.4 cm x 3.6 cm x 5.1 cm Left lobe: 4.6 cm x 2.0 cm x 2.2 cm _________________________________________________________ Estimated total number of nodules >/= 1 cm: 1 Number of spongiform nodules >/=  2 cm not described below (TR1): 0 Number of mixed cystic and solid nodules >/= 1.5 cm not described below ([place]): 0 _________________________________________________________ Ultrasound survey of the thyroid demonstrates infiltrative process involving the right thyroid lobe, with extrathyroid extension, similar to that which is seen on the comparison CT study. There are pathologic lymph nodes of the right and left neck. No nodules identified within the left thyroid tissue. IMPRESSION: Thyroid ultrasound again demonstrates infiltrative process involving the right thyroid with extrathyroid extension and bilateral head and neck pathologic lymph nodes. Referral for  biopsy is recommended. Leading differential diagnosis is malignancy, potentially thyroid carcinoma or possibly lymphoma. Note that the referral for biopsy should be for neck mass biopsy, so that a formal core biopsy may be performed of either lymph nodes, thyroid mass, or both. Signed, Dulcy Fanny. Earleen Newport, DO [facility]iology Electronically Signed   By: Corrie Mckusick D.O.   On: 07/03/2017 15:27    Labs:  CBC: Recent Labs    07/09/17 2156  WBC 9.4  HGB 13.5  HCT 41.9  PLT 293    COAGS: No results for input(s): INR, APTT in the last 8760 hours.  BMP: Recent Labs    06/24/17 1609 07/09/17 2156  NA  --  136  K  --  3.7  CL  --  99*  CO2  --  26  GLUCOSE  --  159*  BUN  --  19  CALCIUM  --  9.5  CREATININE 0.90 1.01  GFRNONAA  --  >60  GFRAA  --  >60    LIVER FUNCTION TESTS: No results for input(s): BILITOT, AST, ALT, ALKPHOS, PROT, ALBUMIN in the last 8760 hours.  TUMOR MARKERS: No results for input(s): AFPTM, CEA, CA199, CHROMGRNA in the last 8760 hours.  Assessment and Plan:  Right neck mass Cough; hemoptysis Trouble swallowing even pills Scheduled for biopsy of mass Risks and benefits discussed with the patient including, but not limited to bleeding, infection, damage to adjacent structures or low yield requiring additional tests. All of the patient's questions were answered, patient is agreeable to proceed. Consent signed and in chart.  Thank you for this interesting consult.  I greatly enjoyed meeting Aaron Key and look forward to participating in their care.  A copy of this report was sent to the requesting provider on this date.  Electronically Signed: Lavonia Drafts, PA-C 07/12/2017, 6:49 AM   I spent a total of  30 Minutes   in face to face in clinical consultation, greater than 50% of which was counseling/coordinating care for right neck mass bx

## 2017-07-12 NOTE — Sedation Documentation (Signed)
Patient is resting comfortably. 

## 2017-07-12 NOTE — Discharge Instructions (Addendum)

## 2017-07-12 NOTE — Procedures (Signed)
Pre Procedure Dx: Cervical lymphadenopathy Post Procedural Dx: Same  Technically successful US guided biopsy of right cervical lymph node  EBL: Minimal  No immediate complications.   Ronny Bacon, MD Pager #: [phone]

## 2017-07-16 DIAGNOSIS — E079 Disorder of thyroid, unspecified: Secondary | ICD-10-CM | POA: Diagnosis not present

## 2017-07-16 DIAGNOSIS — J3801 Paralysis of vocal cords and larynx, unilateral: Secondary | ICD-10-CM | POA: Diagnosis not present

## 2017-07-16 DIAGNOSIS — C73 Malignant neoplasm of thyroid gland: Secondary | ICD-10-CM | POA: Diagnosis not present

## 2017-07-16 DIAGNOSIS — J3800 Paralysis of vocal cords and larynx, unspecified: Secondary | ICD-10-CM | POA: Diagnosis not present

## 2017-07-22 DIAGNOSIS — R1310 Dysphagia, unspecified: Secondary | ICD-10-CM | POA: Diagnosis present

## 2017-07-22 DIAGNOSIS — C73 Malignant neoplasm of thyroid gland: Secondary | ICD-10-CM | POA: Diagnosis present

## 2017-07-22 DIAGNOSIS — E119 Type 2 diabetes mellitus without complications: Secondary | ICD-10-CM | POA: Diagnosis present

## 2017-07-22 DIAGNOSIS — G4733 Obstructive sleep apnea (adult) (pediatric): Secondary | ICD-10-CM | POA: Diagnosis present

## 2017-07-22 DIAGNOSIS — Z8679 Personal history of other diseases of the circulatory system: Secondary | ICD-10-CM | POA: Diagnosis not present

## 2017-07-22 DIAGNOSIS — G4700 Insomnia, unspecified: Secondary | ICD-10-CM | POA: Diagnosis present

## 2017-07-22 DIAGNOSIS — Z510 Encounter for antineoplastic radiation therapy: Secondary | ICD-10-CM | POA: Diagnosis not present

## 2017-07-22 DIAGNOSIS — R633 Feeding difficulties: Secondary | ICD-10-CM | POA: Diagnosis not present

## 2017-07-22 DIAGNOSIS — J398 Other specified diseases of upper respiratory tract: Secondary | ICD-10-CM | POA: Diagnosis not present

## 2017-07-22 DIAGNOSIS — Z430 Encounter for attention to tracheostomy: Secondary | ICD-10-CM | POA: Diagnosis not present

## 2017-07-22 DIAGNOSIS — Z66 Do not resuscitate: Secondary | ICD-10-CM | POA: Diagnosis not present

## 2017-07-22 DIAGNOSIS — F418 Other specified anxiety disorders: Secondary | ICD-10-CM | POA: Diagnosis not present

## 2017-07-22 DIAGNOSIS — R0600 Dyspnea, unspecified: Secondary | ICD-10-CM | POA: Diagnosis not present

## 2017-07-22 DIAGNOSIS — F411 Generalized anxiety disorder: Secondary | ICD-10-CM | POA: Diagnosis not present

## 2017-07-22 DIAGNOSIS — Z8639 Personal history of other endocrine, nutritional and metabolic disease: Secondary | ICD-10-CM | POA: Diagnosis not present

## 2017-07-22 DIAGNOSIS — Z515 Encounter for palliative care: Secondary | ICD-10-CM | POA: Diagnosis not present

## 2017-07-22 DIAGNOSIS — D638 Anemia in other chronic diseases classified elsewhere: Secondary | ICD-10-CM | POA: Diagnosis present

## 2017-07-22 DIAGNOSIS — G8918 Other acute postprocedural pain: Secondary | ICD-10-CM | POA: Diagnosis not present

## 2017-07-22 DIAGNOSIS — F4323 Adjustment disorder with mixed anxiety and depressed mood: Secondary | ICD-10-CM | POA: Diagnosis not present

## 2017-07-22 DIAGNOSIS — F419 Anxiety disorder, unspecified: Secondary | ICD-10-CM | POA: Diagnosis present

## 2017-07-22 DIAGNOSIS — Z930 Tracheostomy status: Secondary | ICD-10-CM | POA: Diagnosis not present

## 2017-07-22 DIAGNOSIS — E43 Unspecified severe protein-calorie malnutrition: Secondary | ICD-10-CM | POA: Diagnosis present

## 2017-07-22 DIAGNOSIS — K219 Gastro-esophageal reflux disease without esophagitis: Secondary | ICD-10-CM | POA: Diagnosis present

## 2017-07-22 DIAGNOSIS — Z4659 Encounter for fitting and adjustment of other gastrointestinal appliance and device: Secondary | ICD-10-CM | POA: Diagnosis not present

## 2017-07-22 DIAGNOSIS — G839 Paralytic syndrome, unspecified: Secondary | ICD-10-CM | POA: Diagnosis present

## 2017-07-22 DIAGNOSIS — J3800 Paralysis of vocal cords and larynx, unspecified: Secondary | ICD-10-CM | POA: Diagnosis not present

## 2017-07-22 DIAGNOSIS — I10 Essential (primary) hypertension: Secondary | ICD-10-CM | POA: Diagnosis present

## 2017-07-22 DIAGNOSIS — G893 Neoplasm related pain (acute) (chronic): Secondary | ICD-10-CM | POA: Diagnosis not present

## 2017-07-24 ENCOUNTER — Ambulatory Visit: Payer: Medicare Other | Admitting: Family Medicine

## 2017-07-30 DIAGNOSIS — Z510 Encounter for antineoplastic radiation therapy: Secondary | ICD-10-CM | POA: Diagnosis not present

## 2017-07-30 DIAGNOSIS — C73 Malignant neoplasm of thyroid gland: Secondary | ICD-10-CM | POA: Diagnosis not present

## 2017-08-06 DIAGNOSIS — C73 Malignant neoplasm of thyroid gland: Secondary | ICD-10-CM | POA: Diagnosis not present

## 2017-08-06 DIAGNOSIS — R1310 Dysphagia, unspecified: Secondary | ICD-10-CM | POA: Diagnosis not present

## 2017-08-06 DIAGNOSIS — I10 Essential (primary) hypertension: Secondary | ICD-10-CM | POA: Diagnosis not present

## 2017-08-06 DIAGNOSIS — K219 Gastro-esophageal reflux disease without esophagitis: Secondary | ICD-10-CM | POA: Diagnosis not present

## 2017-08-06 DIAGNOSIS — E119 Type 2 diabetes mellitus without complications: Secondary | ICD-10-CM | POA: Diagnosis not present

## 2017-08-06 MED ORDER — DEXTROSE 10 % IV SOLN
125.00 | INTRAVENOUS | Status: DC
Start: ? — End: 2017-08-06

## 2017-08-06 MED ORDER — ENOXAPARIN SODIUM 40 MG/0.4ML ~~LOC~~ SOLN
40.00 | SUBCUTANEOUS | Status: DC
Start: 2017-08-07 — End: 2017-08-06

## 2017-08-06 MED ORDER — RANITIDINE HCL 150 MG PO TABS
150.00 | ORAL_TABLET | ORAL | Status: DC
Start: 2017-08-06 — End: 2017-08-06

## 2017-08-06 MED ORDER — MELATONIN 3 MG PO TABS
6.00 | ORAL_TABLET | ORAL | Status: DC
Start: 2017-08-06 — End: 2017-08-06

## 2017-08-06 MED ORDER — GENERIC EXTERNAL MEDICATION
40.00 | Status: DC
Start: 2017-08-06 — End: 2017-08-06

## 2017-08-06 MED ORDER — INSULIN LISPRO 100 UNIT/ML ~~LOC~~ SOLN
2.00 | SUBCUTANEOUS | Status: DC
Start: 2017-08-06 — End: 2017-08-06

## 2017-08-06 MED ORDER — POLYETHYLENE GLYCOL 3350 17 G PO PACK
17.00 g | PACK | ORAL | Status: DC
Start: 2017-08-07 — End: 2017-08-06

## 2017-08-06 MED ORDER — OXYCODONE HCL 5 MG PO TABS
5.00 | ORAL_TABLET | ORAL | Status: DC
Start: ? — End: 2017-08-06

## 2017-08-06 MED ORDER — GENERIC EXTERNAL MEDICATION
Status: DC
Start: 2017-08-06 — End: 2017-08-06

## 2017-08-06 MED ORDER — ACETAMINOPHEN 325 MG PO TABS
650.00 | ORAL_TABLET | ORAL | Status: DC
Start: ? — End: 2017-08-06

## 2017-08-06 MED ORDER — GENERIC EXTERNAL MEDICATION
10.00 | Status: DC
Start: 2017-08-07 — End: 2017-08-06

## 2017-08-06 MED ORDER — DIPHENHYDRAMINE HCL 25 MG PO CAPS
25.00 | ORAL_CAPSULE | ORAL | Status: DC
Start: ? — End: 2017-08-06

## 2017-08-06 MED ORDER — SODIUM CHLORIDE 0.9 % IJ SOLN
10.00 | INTRAMUSCULAR | Status: DC
Start: ? — End: 2017-08-06

## 2017-08-06 MED ORDER — NYSTATIN 100000 UNIT/GM EX POWD
CUTANEOUS | Status: DC
Start: 2017-08-06 — End: 2017-08-06

## 2017-08-06 MED ORDER — ALPRAZOLAM 0.5 MG PO TABS
0.50 | ORAL_TABLET | ORAL | Status: DC
Start: ? — End: 2017-08-06

## 2017-08-06 MED ORDER — LOSARTAN POTASSIUM 50 MG PO TABS
100.00 | ORAL_TABLET | ORAL | Status: DC
Start: 2017-08-07 — End: 2017-08-06

## 2017-08-12 ENCOUNTER — Telehealth: Payer: Self-pay | Admitting: Family Medicine

## 2017-08-12 ENCOUNTER — Telehealth: Payer: Self-pay | Admitting: *Deleted

## 2017-08-12 NOTE — Telephone Encounter (Signed)
Spoke with patient's wife who stated he only got about half his feeling this weekend because the care take is scared to do the large feedings and is going to try to slowly increase slightly everyday and see how it goes and how he tolerates it.  Wife notified  that tegaderm foam dressings and aquacel foam dressing are virtually the same as the mepilex lite he is using, if they cannot find these either, they can use the duoderm thin dressing which would likely do just about as good.   Wife verbalized understanding and stated patient had a rough day yesterday-he was worn out from a lot of visitors and they are going to limit visitors for now.  Contacted Peri Jefferson at [facility] [employer] and ordered 6 "trach kits" with refills, and 1000 cc's saline solution with refills also ordered  neb machine and ventolin  2.5 in three cc's to use qhs, dx reac airways.

## 2017-08-12 NOTE — Telephone Encounter (Signed)
Pt facing very serious thyroid ca, now with trach and g tube,  Contact personpaula Lucena, may need to also get the naME AND contact numb of the person he is currently staying with (actually his ex wife so that paula can cont to work is explanation I got, but this ex wife apparently also has power of attorney?)  Ask them this morning how david is handling his nutr feedings (they were thinking it might cause hives earlier in the weekend)  Any rate, they wanted me to do/research the following  Let them know that tegaderm foam dressings and aquacel foam dressing are virtually the same as the mepilex lite he is using, if they cannot find these either, they can use the duoderm thin dressing which would likely do just about as good  Need to call ryan joyce at [facility] and order 6 "trach kits" with refills, and 1000 cc's alsine soln sith refills  Need to rx neb machine and vent 2.5 in three cc's to use qhs, dx reac airways, ask fam if they want [place] to call that into [facility] also (they also [facility], not sure if [facility] does neb machines)  Later this morn let me spk with Fatima Blank hospice nurse

## 2017-08-12 NOTE — Telephone Encounter (Signed)
Patient had Rx for nebulizer machine, some kind of compressor and medication for the machine called in today to [facility]  He is in [facility]. [employer], who is taking care of Aaron Key, is requesting this to either be sent to [facility] or [facility].  [employer] his home deliveries.

## 2017-08-12 NOTE — Telephone Encounter (Signed)
Home visit per Dr Richardson Landry

## 2017-08-12 NOTE — Telephone Encounter (Signed)
thx

## 2017-08-13 NOTE — Telephone Encounter (Addendum)
Prescription called into  pharmacy-delivery. Caregiver notified.

## 2017-08-13 NOTE — Telephone Encounter (Signed)
biaxin susp 500 bid ten d

## 2017-08-13 NOTE — Telephone Encounter (Signed)
Care taker called back and was concerned that patient just started running a fever of 99.7 under the arm. Family was told his lungs sounded a little gunky and was on Zithromax solution but had to stop it because it made him burn in his private when he took it. Family feels he may need a different antibiotic sent into [facility]

## 2017-08-13 NOTE — Telephone Encounter (Signed)
ok 

## 2017-08-14 ENCOUNTER — Telehealth: Payer: Self-pay | Admitting: Family Medicine

## 2017-08-14 ENCOUNTER — Other Ambulatory Visit: Payer: Self-pay | Admitting: Family Medicine

## 2017-08-14 MED ORDER — CEPHALEXIN 250 MG/5ML PO SUSR: ORAL | 0 refills | Status: DC

## 2017-08-14 NOTE — Telephone Encounter (Signed)
Spoke with daughter and she stated understanding.

## 2017-08-14 NOTE — Telephone Encounter (Signed)
Called yesterday and had Doxy sent in; was suppose to have liquid ABT but Hospice would not pay for it so Doxy tablets were sent in. Daughter called and stated that they did not take ABT delivered yesterday due to not knowing if they could crush the tablet. Daughter also wanted to know if something could be sent in because pt is having painful urination and low grade fever and daughter is thinking he may have a UTI. Allergic to penicillin.  This is Dr. Jeannine Kitten patient but daughter was adamant about having something done today. Please advise.

## 2017-08-14 NOTE — Telephone Encounter (Signed)
Keflex 250mg /34ml 1 tsp qid for 7 days, can take cephalosporins

## 2017-08-16 ENCOUNTER — Ambulatory Visit: Payer: Medicare Other | Admitting: Family Medicine

## 2017-08-23 ENCOUNTER — Telehealth: Payer: Self-pay | Admitting: Family Medicine

## 2017-08-23 NOTE — Telephone Encounter (Signed)
Called caretaker per Dr. Richardson Landry; Left message to return call at earliest convenience.

## 2017-08-23 NOTE — Telephone Encounter (Signed)
Beth returned call and stated that Monday around 6 would be fine.

## 2017-08-23 NOTE — Telephone Encounter (Signed)
good

## 2017-08-26 ENCOUNTER — Ambulatory Visit (INDEPENDENT_AMBULATORY_CARE_PROVIDER_SITE_OTHER): Payer: Medicare Other | Admitting: Family Medicine

## 2017-08-26 ENCOUNTER — Telehealth: Payer: Self-pay | Admitting: Family Medicine

## 2017-08-26 DIAGNOSIS — C73 Malignant neoplasm of thyroid gland: Secondary | ICD-10-CM

## 2017-08-26 NOTE — Telephone Encounter (Signed)
Urine cup, test strips and bp cuff put in dr steve's chair to carry with him.

## 2017-08-26 NOTE — Telephone Encounter (Signed)
Ok, plz get along with b p cuff to carry there and put in my chair, and add him on to 5:10 slot so I can document

## 2017-08-26 NOTE — Telephone Encounter (Signed)
Added to schedule.

## 2017-08-26 NOTE — Telephone Encounter (Signed)
Dr. Richardson Landry is going to see Aaron Key this afternoon at his house.  Beth Smart is calling to request that Dr. Richardson Landry bring a urine cup and strip to test him.Marland Kitchen  He is having burning with urination.

## 2017-08-27 MED ORDER — CEPHALEXIN 250 MG/5ML PO SUSR: ORAL | 0 refills | Status: DC

## 2017-08-27 MED ORDER — ONDANSETRON 4 MG PO TBDP: 4.0000 mg | ORAL_TABLET | Freq: Three times a day (TID) | ORAL | 0 refills | Status: AC | PRN

## 2017-09-04 ENCOUNTER — Other Ambulatory Visit: Payer: Self-pay

## 2017-09-04 ENCOUNTER — Telehealth: Payer: Self-pay

## 2017-09-04 NOTE — Telephone Encounter (Signed)
Opening the encounter per your request.

## 2017-09-04 NOTE — Telephone Encounter (Signed)
Yes, Per Beth ok to come by this afternoon around 6 pm.

## 2017-09-04 NOTE — Telephone Encounter (Signed)
Donnald Garre ben doing weekly home visits on pt, need to call and make sure ok to come this eve, around six or so

## 2017-09-05 ENCOUNTER — Telehealth: Payer: Self-pay

## 2017-09-05 MED ORDER — CEPHALEXIN 250 MG/5ML PO SUSR: ORAL | 0 refills | Status: AC

## 2017-09-05 MED ORDER — HYDROCORTISONE 2.5 % EX LOTN: TOPICAL_LOTION | CUTANEOUS | 11 refills | Status: DC

## 2017-09-05 MED ORDER — HYDROCORTISONE 2.5 % EX LOTN: TOPICAL_LOTION | CUTANEOUS | 11 refills | Status: AC

## 2017-09-05 MED ORDER — CEPHALEXIN 250 MG/5ML PO SUSR: ORAL | 0 refills | Status: DC

## 2017-09-05 NOTE — Telephone Encounter (Signed)
rx hydrocort 2.5% lotion or 2 per cent if they dont have 2.5 6 oz apply bid to rash repfill prn  Represcrivbe the keflex but not to fill but to have on hold   On chronic med list, remova all meds but the zofran odt, so there fore earlier question yest about farxiga we are cancelling along with all othe meds

## 2017-09-05 NOTE — Telephone Encounter (Signed)
Aaron Key is aware we have sent to [facility]

## 2017-09-05 NOTE — Telephone Encounter (Signed)
Opening a encounter per your request.

## 2017-09-07 DIAGNOSIS — C73 Malignant neoplasm of thyroid gland: Secondary | ICD-10-CM | POA: Insufficient documentation

## 2017-09-07 NOTE — Progress Notes (Signed)
Patient seen    For a courtesy house call.  Sadly his workup revealed poorly differentiated thyroid carcinoma.  This was judged to be inoperable.  Also it was felt to far advancing to spread for any substantial hope from interventional chemotherapy, even with the new immune therapies.  Patient reports some neck pain but not bad.  Some irritation at the site of his localized radiation  Vitals stable blood pressure 100/60 patient is sitting in no acute distress.  Lungs clear.  Heart regular rate and rhythm.  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  Patient has already connected with hospice and visited their facility.  He is not ready for the facility at this time.  Currently receiving G-tube feedings.  .  Dipstick suggest urinary tract infection.  We will call in Keflex for this.  Has had some dysuria  , However it should be noted intake and water alone.  He feels that the new treatments are causing him side effects.  He also feels the new treatments are unnecessarily prolonging his life.  He states he is ready to go

## 2017-09-17 ENCOUNTER — Telehealth: Payer: Self-pay | Admitting: Family Medicine

## 2017-09-17 NOTE — Telephone Encounter (Signed)
Left msg to return call

## 2017-09-17 NOTE — Telephone Encounter (Signed)
Pts ex wife, Eustaquio Maize, returned call and stated that it would be fine to come out this evening.

## 2017-09-17 NOTE — Telephone Encounter (Signed)
Call pts family (actually his ex wife where he is staying should be on the demograpohics, ask autumn she has been working with), and see if I can come out to house and see pt this eve around six or six thirty(assumin g he has not gone to the hospice facility)

## 2017-09-21 ENCOUNTER — Other Ambulatory Visit: Payer: Self-pay | Admitting: Family Medicine

## 2017-09-23 NOTE — Telephone Encounter (Signed)
I called this in after spking with family Saturday so cancel this order4

## 2017-10-01 ENCOUNTER — Telehealth: Payer: Self-pay | Admitting: *Deleted

## 2017-10-01 NOTE — Telephone Encounter (Signed)
Ok, plz first get pt's wife Mrs Swider on the phone (remind me of first name)

## 2017-10-01 NOTE — Telephone Encounter (Signed)
Aaron Key on the phone for [employer]

## 2017-10-01 NOTE — Telephone Encounter (Signed)
Pt passed away last night.  

## 2017-10-09 DEATH — deceased

## 2018-11-29 IMAGING — DX DG CHEST 2V
3 series · 3 of 3 positions shown · non-contrast
Comparison: None.

CLINICAL DATA: Chronic cough.

EXAM:
CHEST  2 VIEW

[chest pa]
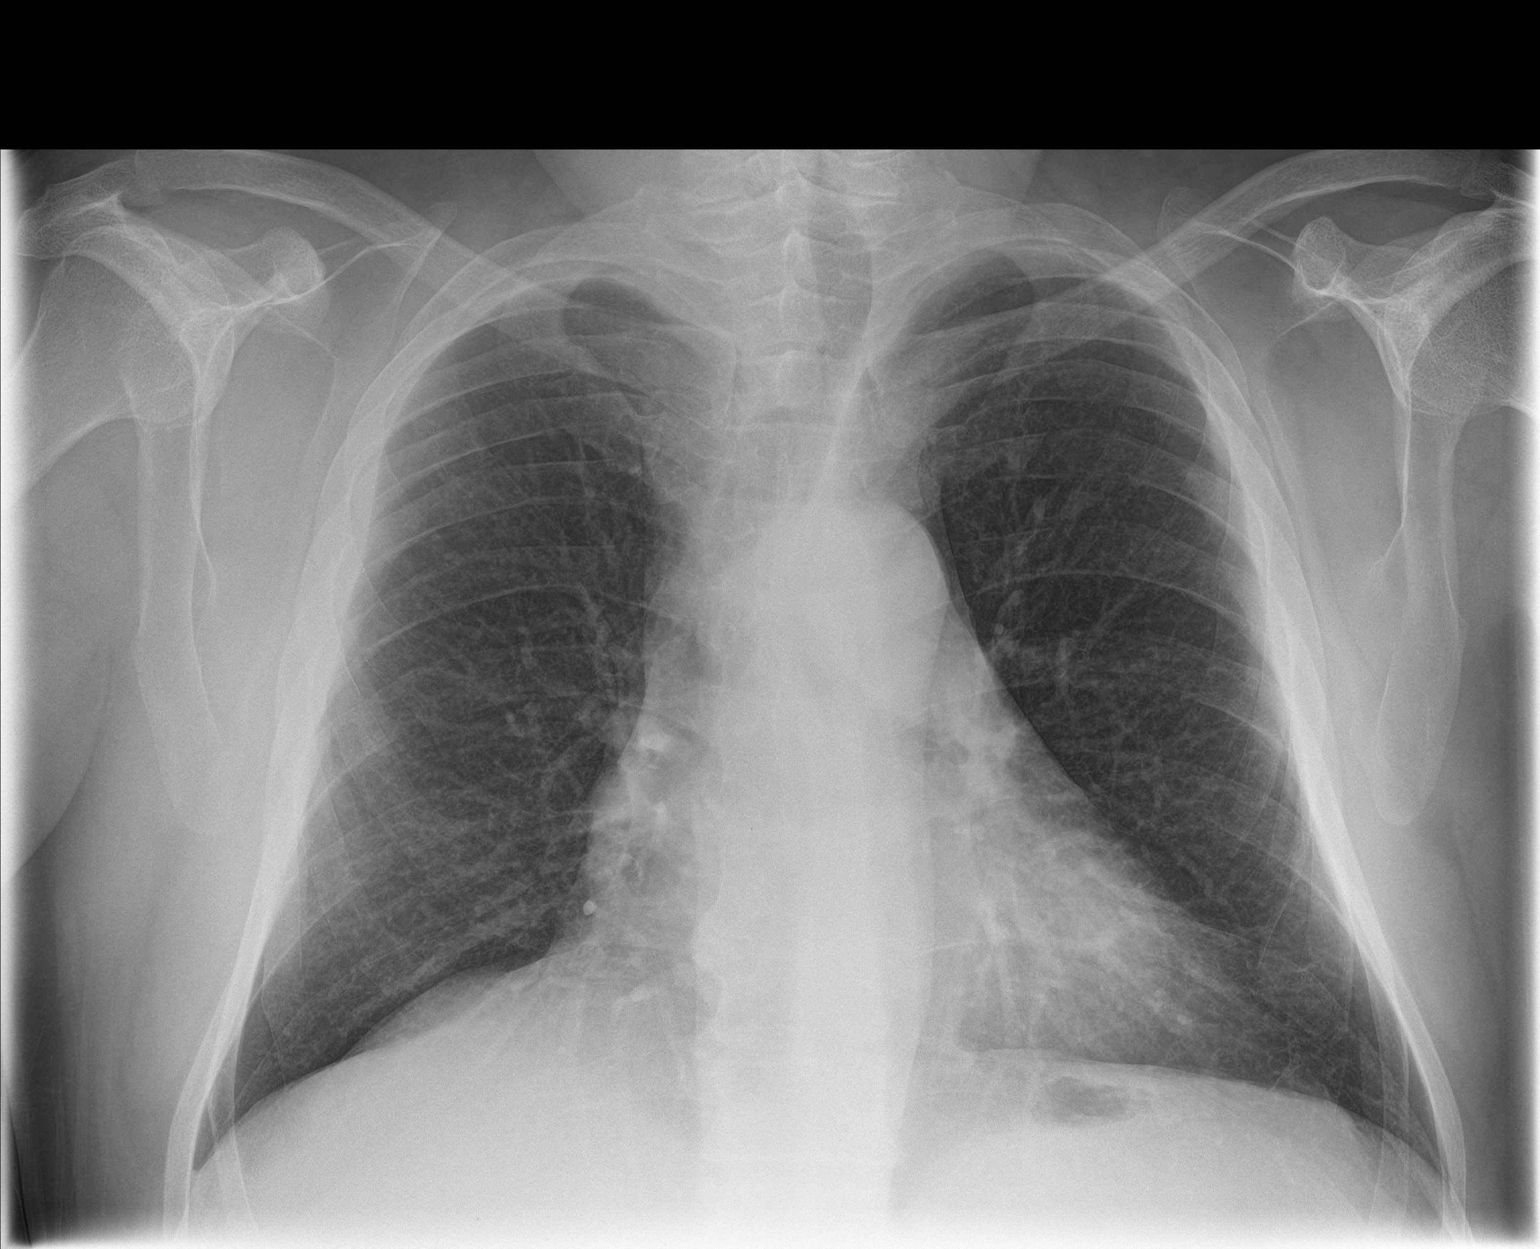

[chest lat (1 of 2)]
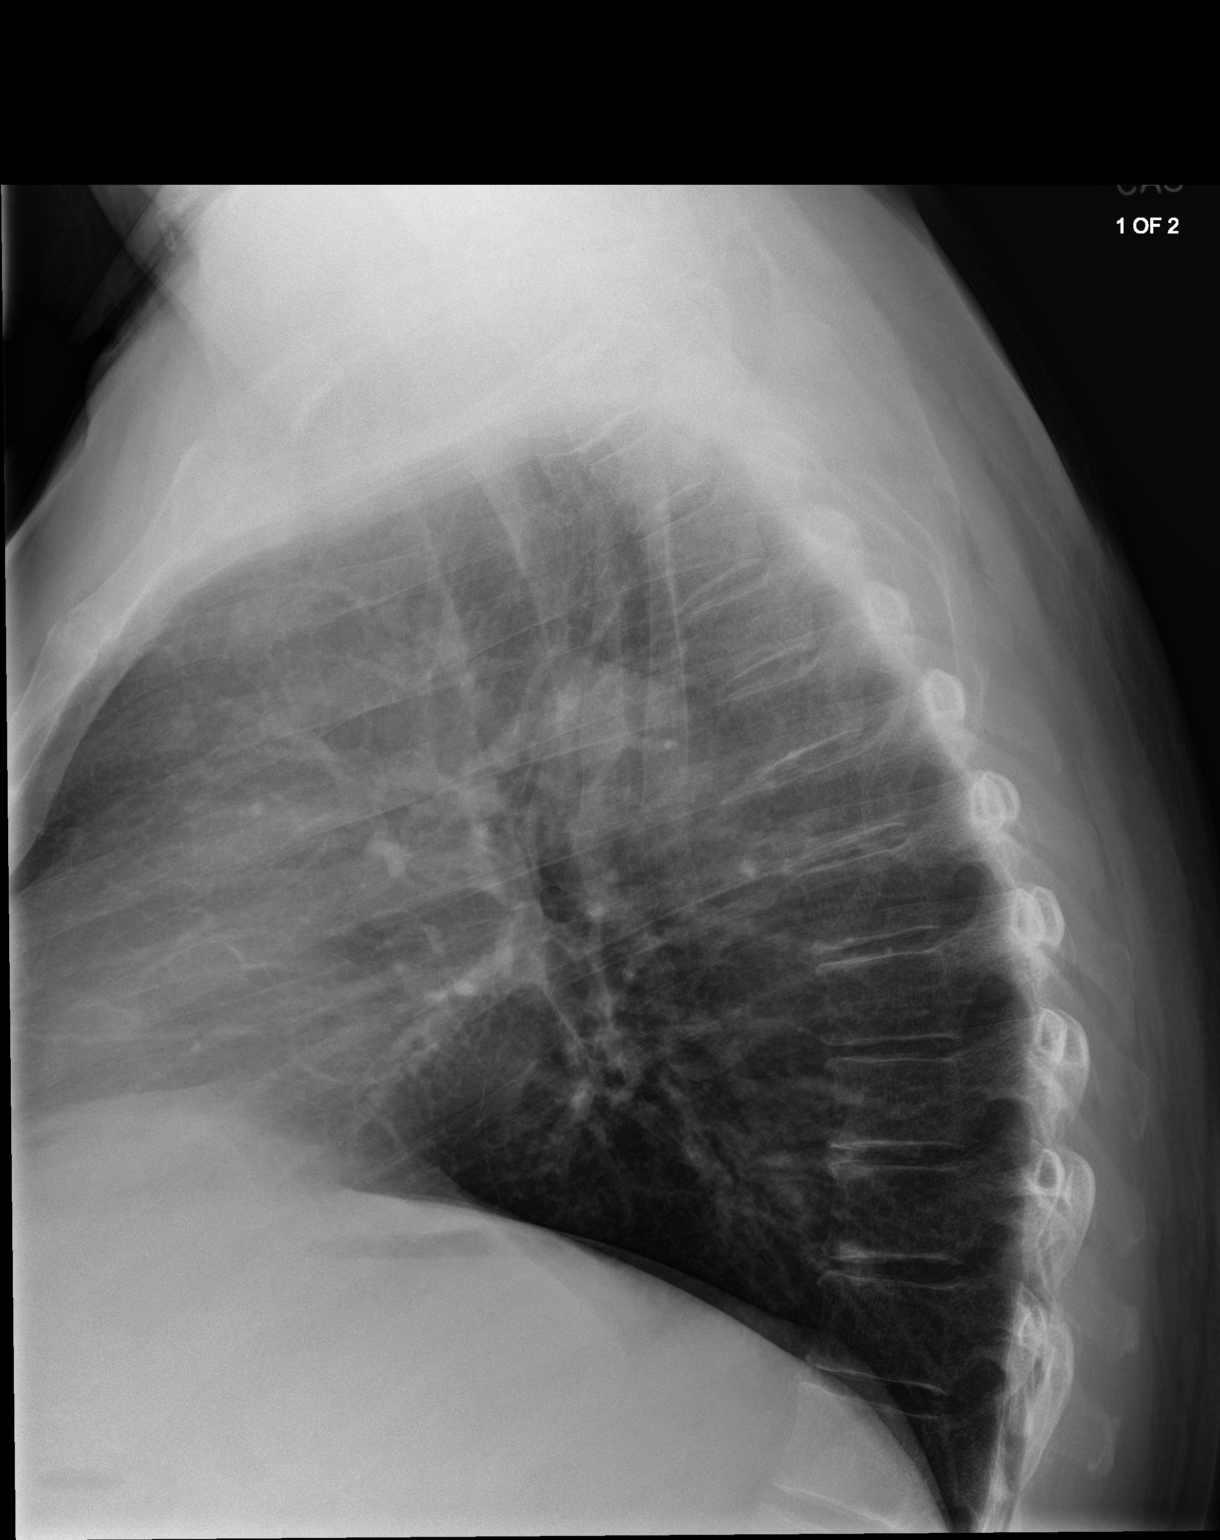

[chest lat (2 of 2)]
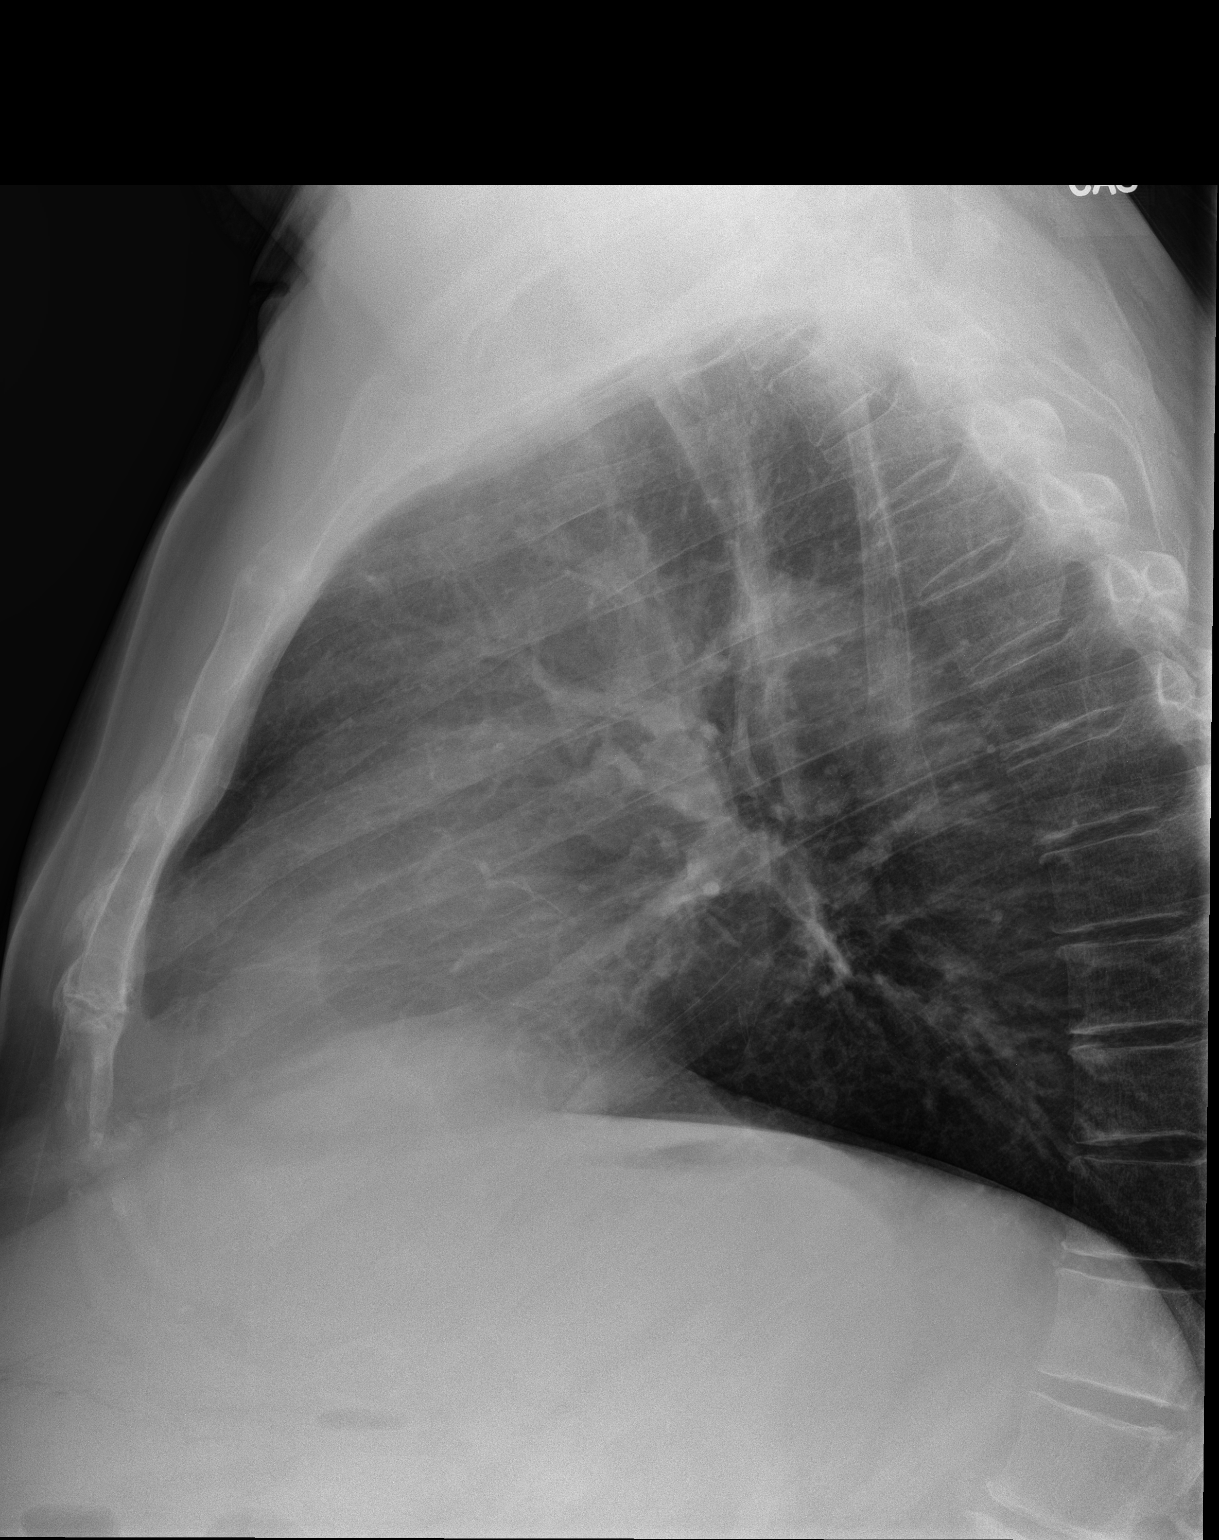

[3 of 3 positions shown; findings below may reference images not displayed]

FINDINGS: The heart size and pulmonary vascularity are normal and the lungs
are clear. No effusions.

There is tracheal deviation to the left just above the thoracic
inlet, probably by an enlarged right lobe of the thyroid gland.

Bones appear normal.
IMPRESSION: 1. Tracheal deviation at the level of the thoracic inlet, probably
by an enlarged right lobe of the thyroid gland. If this has not been
previously assessed, thyroid ultrasound may be useful for further
evaluation.
2. Otherwise, normal exam.

## 2019-04-27 IMAGING — US US THYROID
1 series · 13 of 25 positions shown · non-contrast
Comparison: CT neck 06/24/2017

CLINICAL DATA: 66-year-old male with a history of thyroid nodule

EXAM:
THYROID ULTRASOUND
TECHNIQUE: Ultrasound examination of the thyroid gland and adjacent soft
tissues was performed.

[Series 1: us thyroid · 0.11mm/px · 13 of 72 slices shown]
[im 1/72]
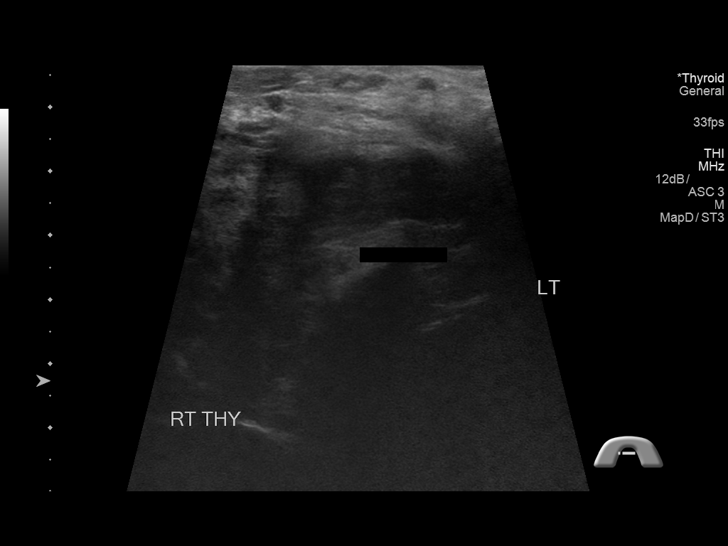
[im 6/72]
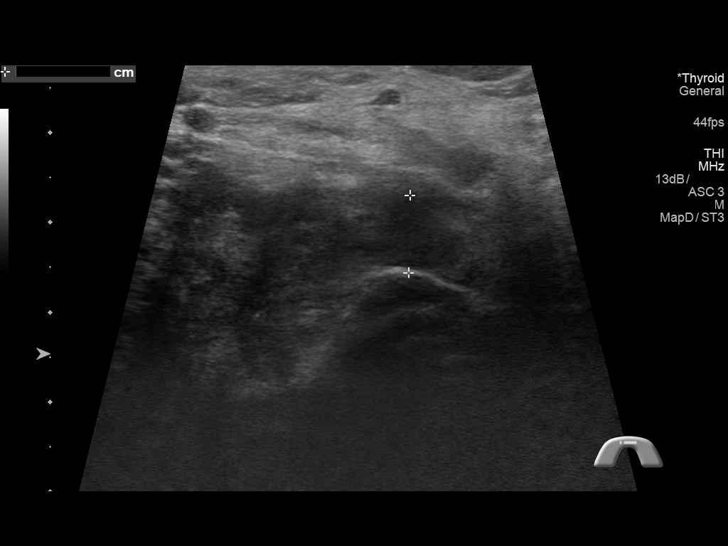
[im 12/72]
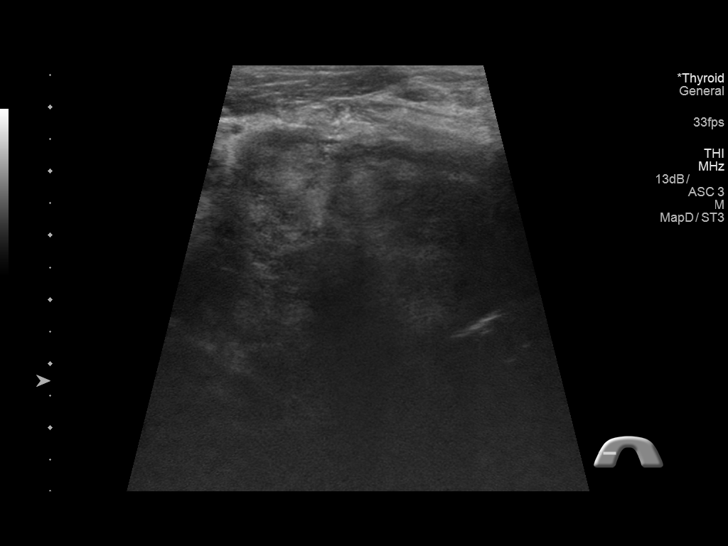
[im 18/72]
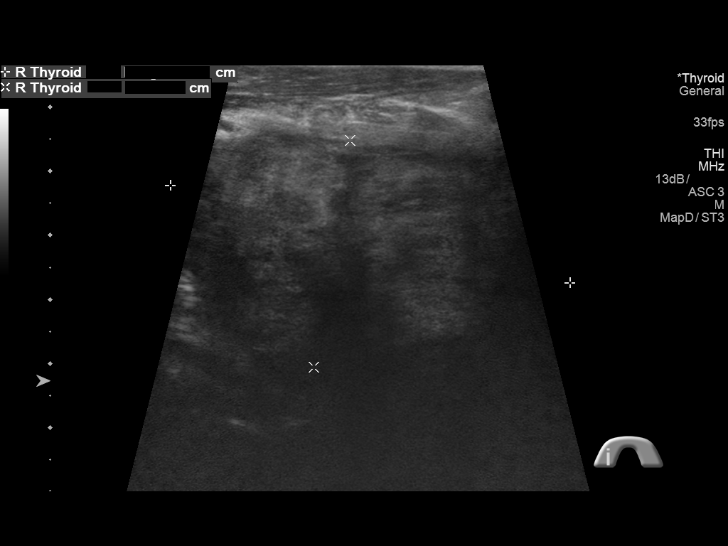
[im 24/72]
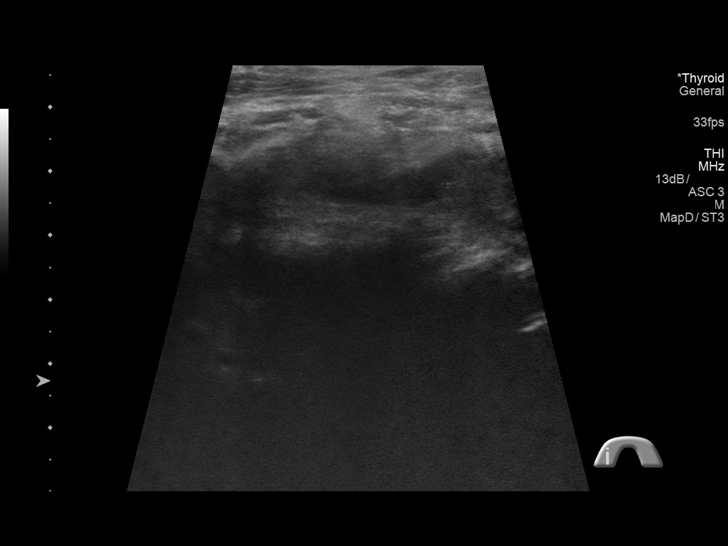
[im 30/72]
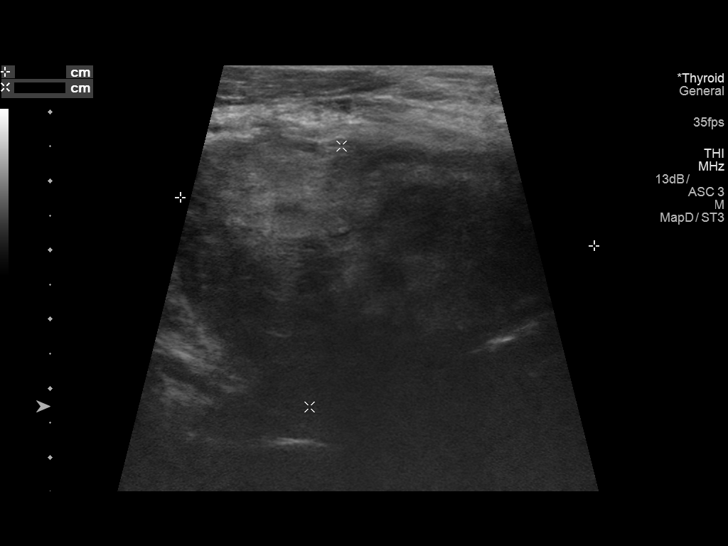
[im 36/72]
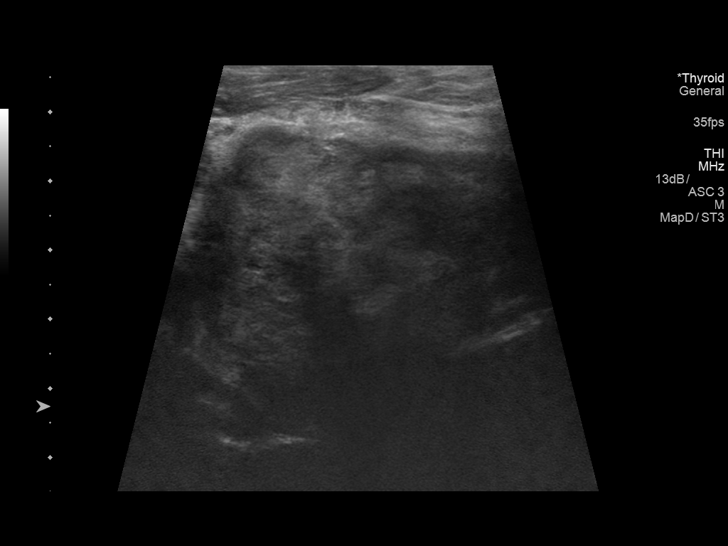
[im 42/72]
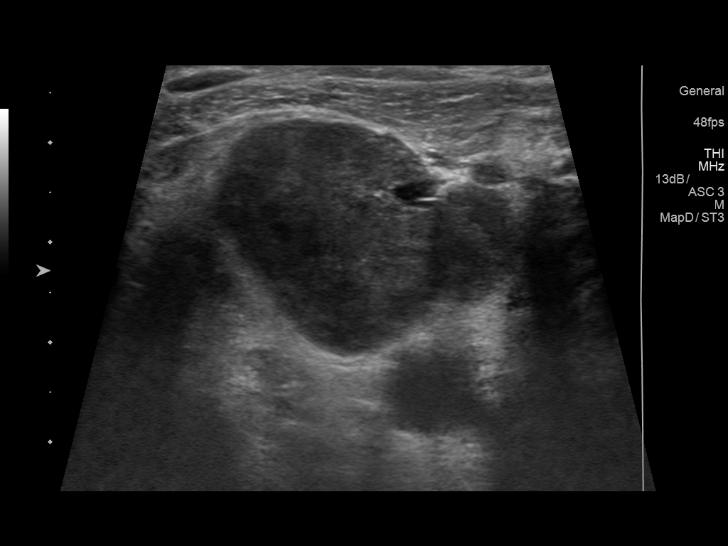
[im 48/72]
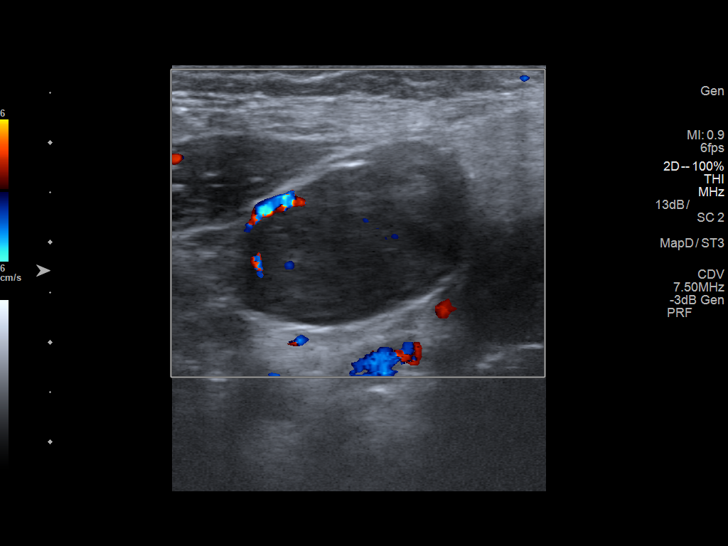
[im 54/72]
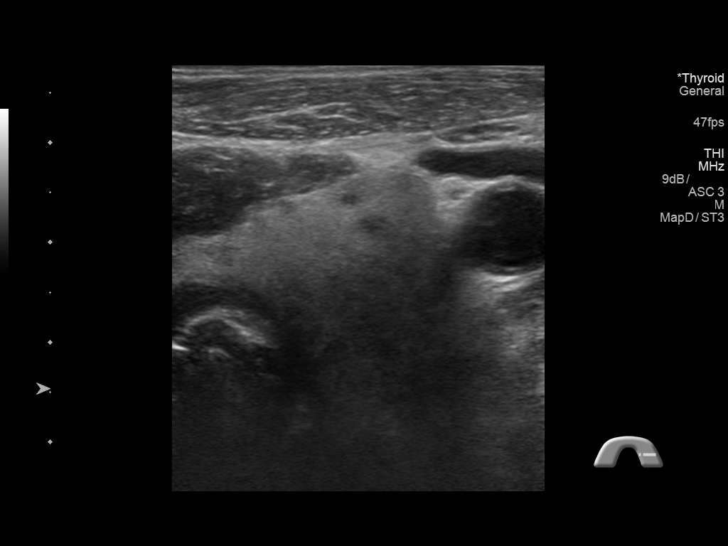
[im 60/72]
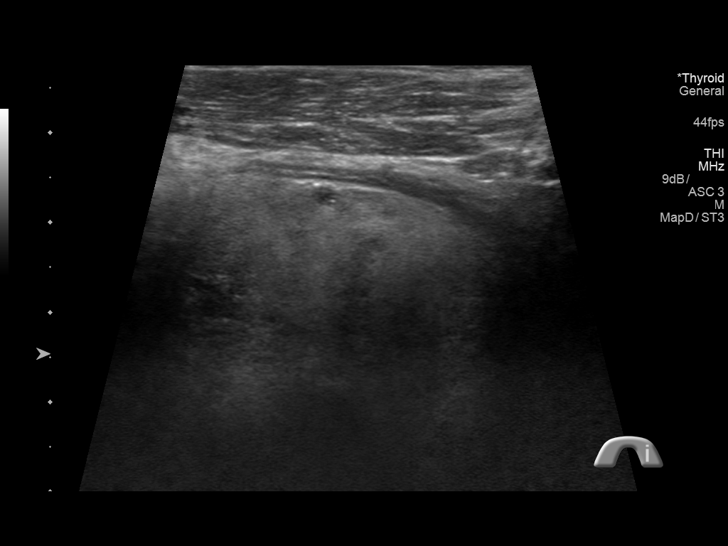
[im 66/72]
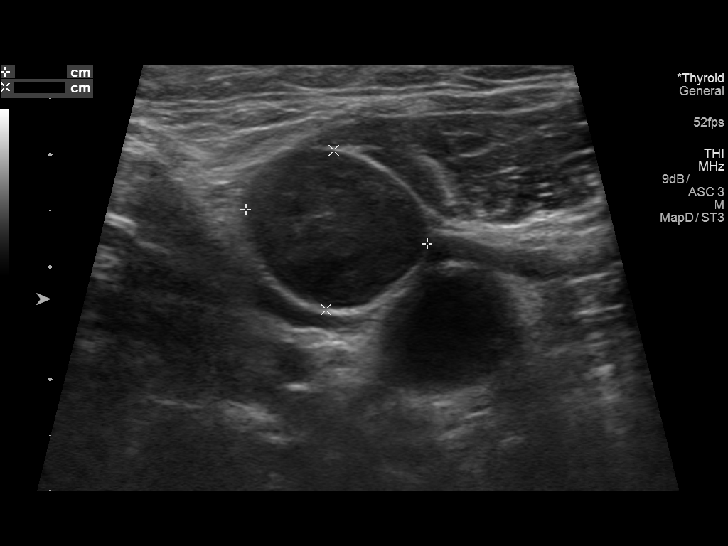
[im 72/72]
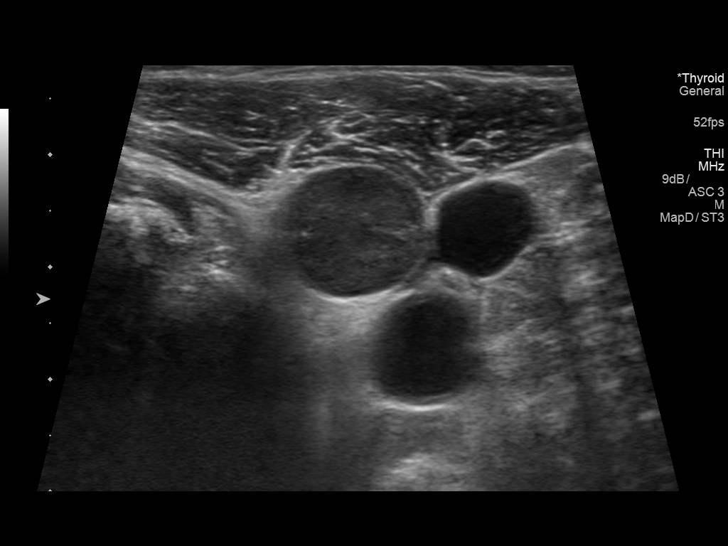

[13 of 25 positions shown; findings below may reference images not displayed]

FINDINGS: Parenchymal Echotexture: Markedly heterogenous

Isthmus: 1.0 cm

Right lobe: 6.4 cm x 3.6 cm x 5.1 cm

Left lobe: 4.6 cm x 2.0 cm x 2.2 cm

_________________________________________________________

Estimated total number of nodules >/= 1 cm: 1

Number of spongiform nodules >/=  2 cm not described below (TR1): 0

Number of mixed cystic and solid nodules >/= 1.5 cm not described
below (TR2): 0

_________________________________________________________

Ultrasound survey of the thyroid demonstrates infiltrative process
involving the right thyroid lobe, with extrathyroid extension,
similar to that which is seen on the comparison CT study.

There are pathologic lymph nodes of the right and left neck.

No nodules identified within the left thyroid tissue.
IMPRESSION: Thyroid ultrasound again demonstrates infiltrative process involving
the right thyroid with extrathyroid extension and bilateral head and
neck pathologic lymph nodes. Referral for biopsy is recommended.
Leading differential diagnosis is malignancy, potentially thyroid
carcinoma or possibly lymphoma.

Note that the referral for biopsy should be for neck mass biopsy, so
that a formal core biopsy may be performed of either lymph nodes,
thyroid mass, or both.

## 2019-05-06 IMAGING — US US BIOPSY LYMPH NODE
1 series · 13 of 19 positions shown · non-contrast
Comparison: Neck CT - 06/24/2017;

INDICATION: Concern for metastatic thyroid cancer with associated right cervical
lymphadenopathy. Please perform cervical lymph node biopsy for
tissue diagnostic purposes.

EXAM:
ULTRASOUND-GUIDED RIGHT CERVICAL LYMPH NODE BIOPSY
TECHNIQUE: Informed written consent was obtained from the patient after a
discussion of the risks, benefits and alternatives to treatment.
Questions regarding the procedure were encouraged and answered.
Initial ultrasound scanning demonstrated multiple pathologically
enlarged right cervical lymph node is. A dominant right level 3
cervical lymph node measuring approximately 2.3 x 1.9 cm correlating
with the dominant cervical lymph node seen on preceding
contrast-enhanced neck CT image 75, series 2, was targeted for
biopsy given location and sonographic window.. An ultrasound image
was saved for documentation purposes. The procedure was planned. A
timeout was performed prior to the initiation of the procedure.

[Series 1: us biopsy lymph node · 0.07mm/px · 13 of 19 slices shown]
[im 1/19]
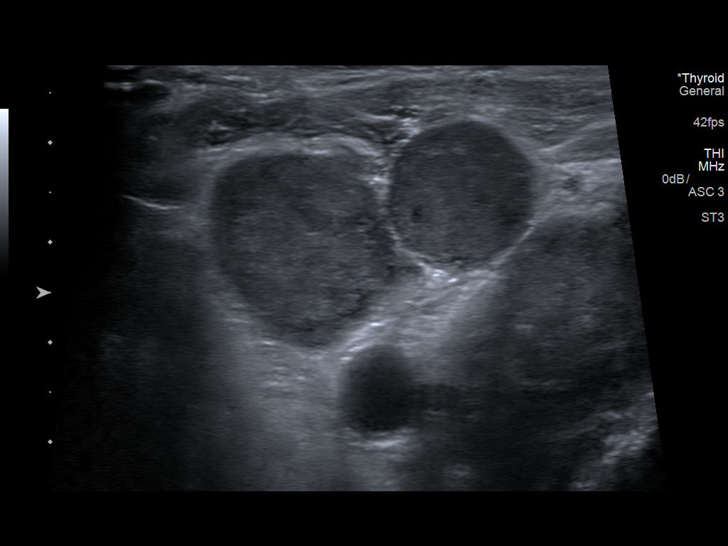
[im 3/19]
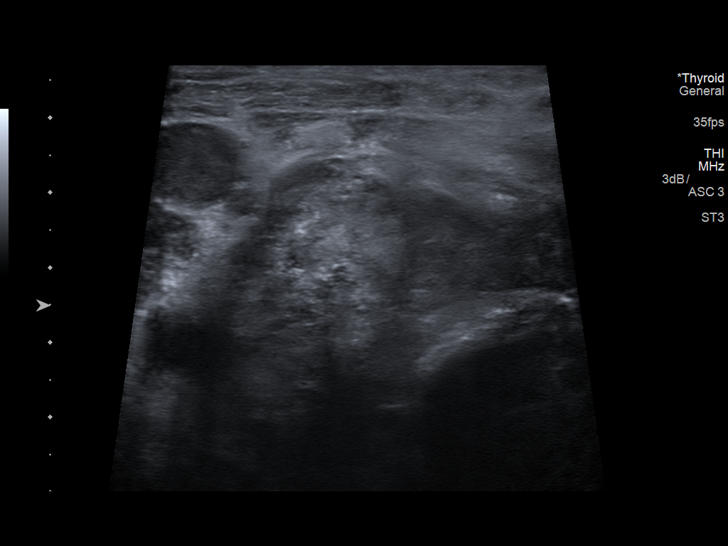
[im 4/19]
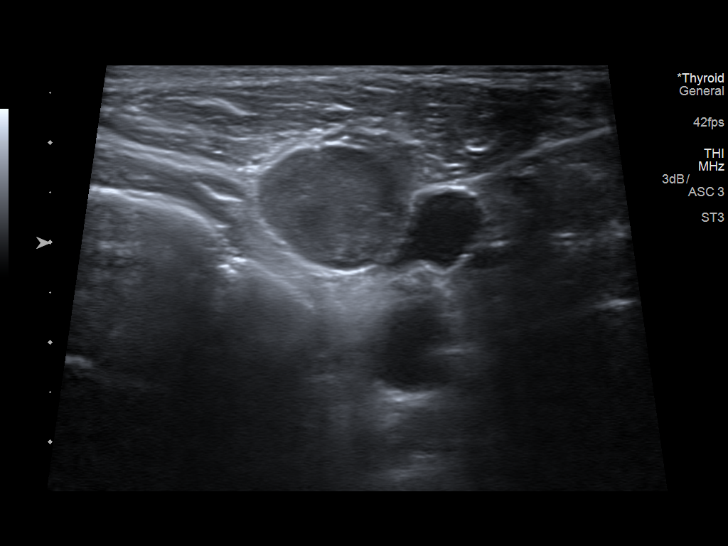
[im 6/19]
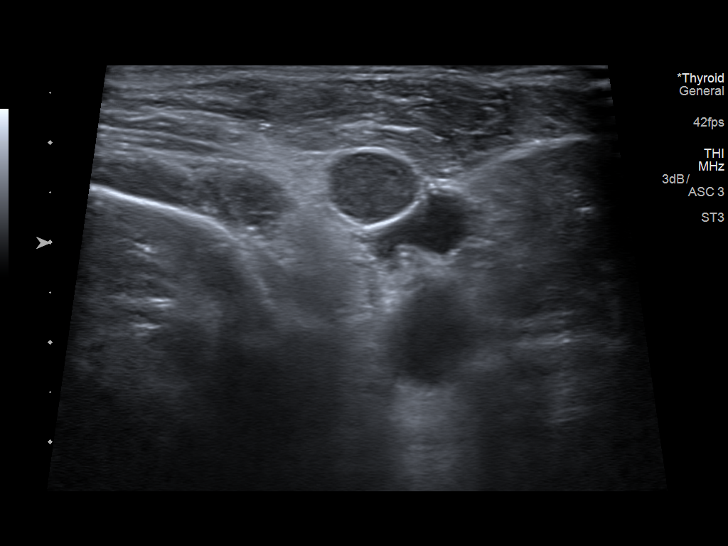
[im 7/19]
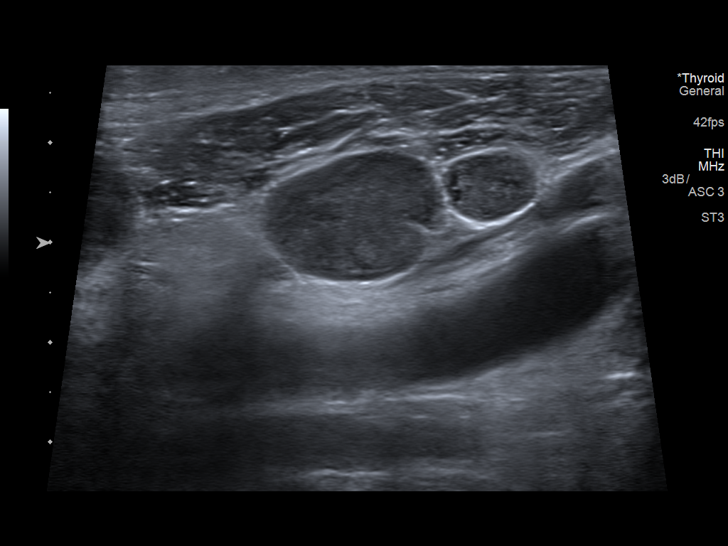
[im 9/19]
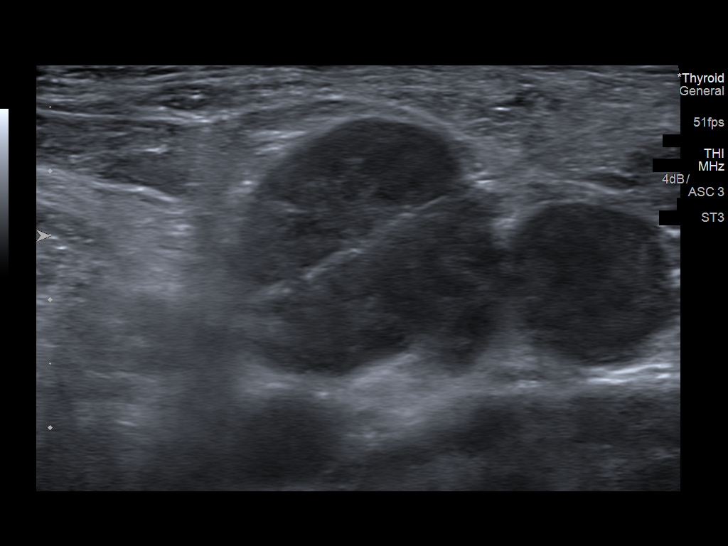
[im 10/19]
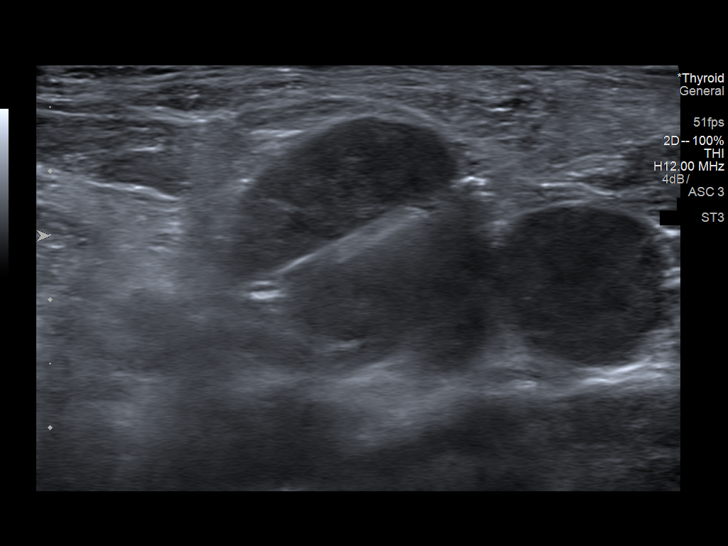
[im 11/19]
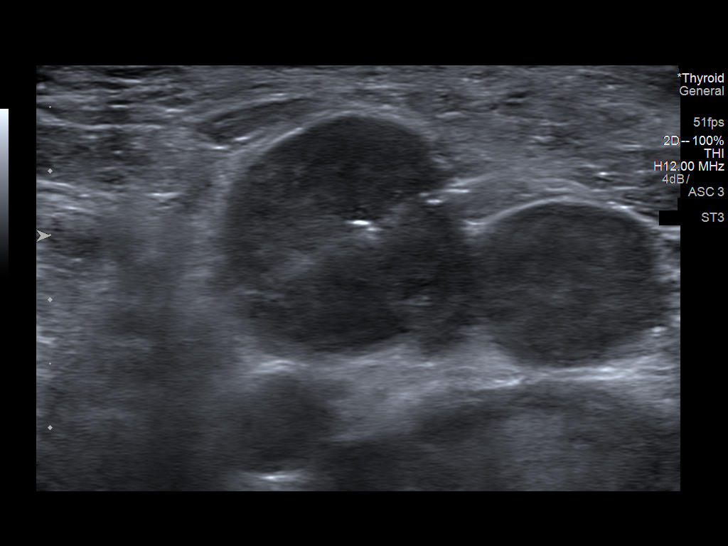
[im 13/19]
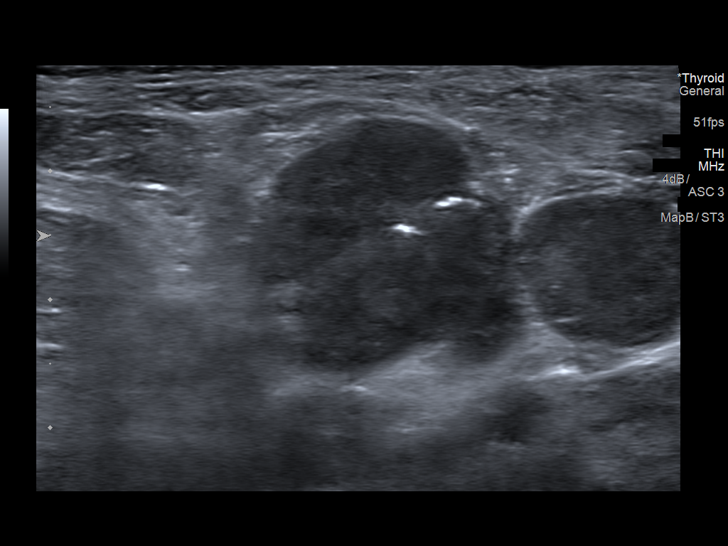
[im 14/19]
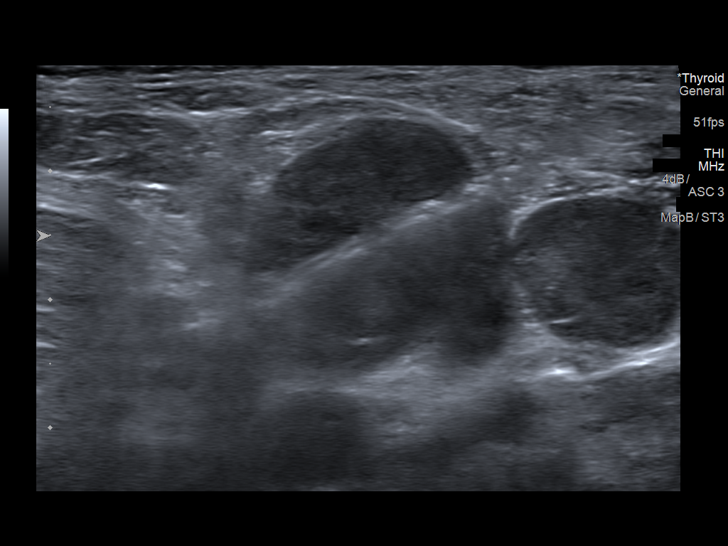
[im 16/19]
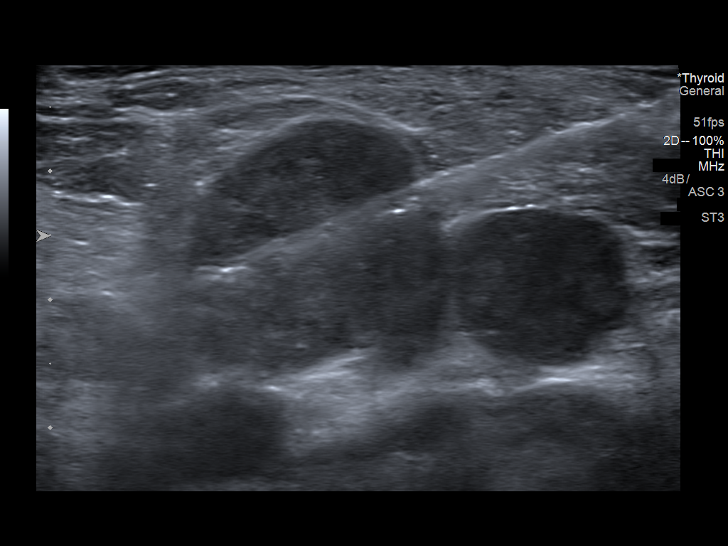
[im 17/19]
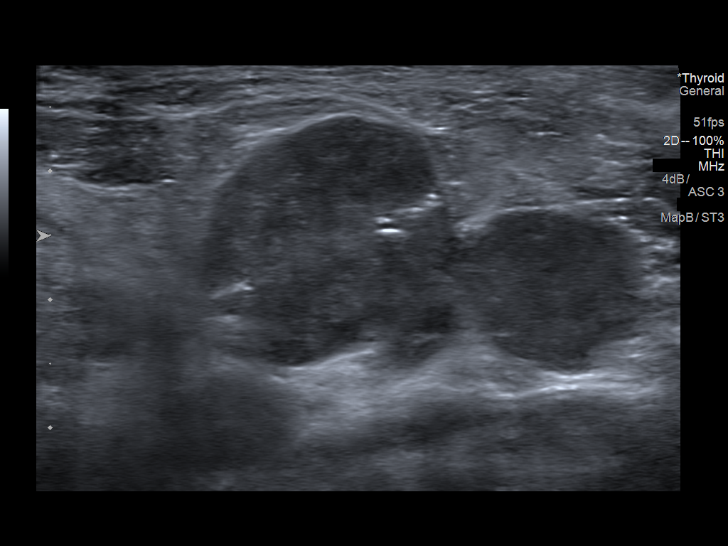
[im 19/19]
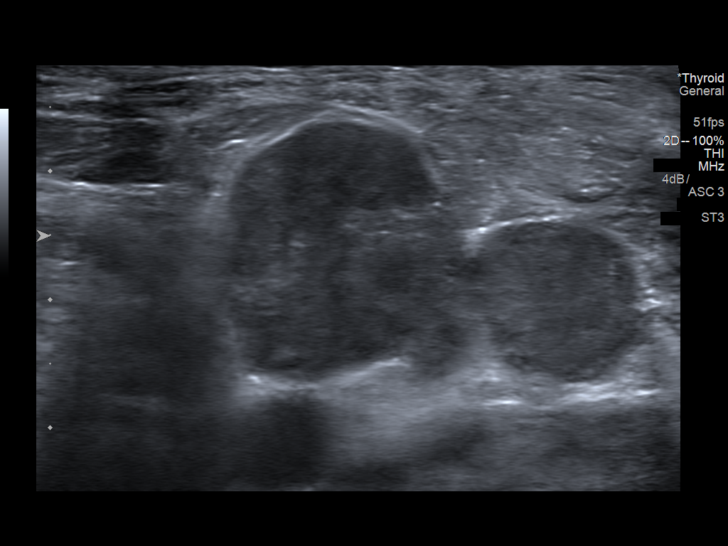

[13 of 19 positions shown; findings below may reference images not displayed]

soft tissue neck ultrasound -
07/03/2017

MEDICATIONS:
None

ANESTHESIA/SEDATION:
Moderate (conscious) sedation was employed during this procedure. A
total of Versed 2 mg and Fentanyl 100 mcg was administered
intravenously.

Moderate Sedation Time: 10 minutes. The patient's level of
consciousness and vital signs were monitored continuously by
radiology nursing throughout the procedure under my direct
supervision.

COMPLICATIONS:
None immediate.
The operative was prepped and draped in the usual sterile fashion,
and a sterile drape was applied covering the operative field. A
timeout was performed prior to the initiation of the procedure.
Local anesthesia was provided with 1% lidocaine with epinephrine.

Under direct ultrasound guidance, an 18 gauge core needle device was
utilized to obtain to obtain 5 core needle biopsies of the dominant
right cervical lymph node.

The samples were placed in saline and submitted to pathology. The
needle was removed and hemostasis was achieved with manual
compression. Post procedure scan was negative for significant
hematoma. A dressing was placed. The patient tolerated the procedure
well without immediate postprocedural complication.
IMPRESSION: Technically successful ultrasound guided biopsy of dominant right
level 3 cervical lymph node.
